# Patient Record
Sex: Female | Born: 1950 | Race: Black or African American | Hispanic: No | Marital: Married | Smoking: Never smoker
Health system: Southern US, Community
[De-identification: ages and names within clinical notes are randomized; demographics above are authoritative.]

## PROBLEM LIST (undated history)

## (undated) DIAGNOSIS — D573 Sickle-cell trait: Secondary | ICD-10-CM

## (undated) DIAGNOSIS — M069 Rheumatoid arthritis, unspecified: Secondary | ICD-10-CM

## (undated) DIAGNOSIS — R509 Fever, unspecified: Secondary | ICD-10-CM

## (undated) DIAGNOSIS — M810 Age-related osteoporosis without current pathological fracture: Secondary | ICD-10-CM

## (undated) DIAGNOSIS — K5790 Diverticulosis of intestine, part unspecified, without perforation or abscess without bleeding: Secondary | ICD-10-CM

## (undated) DIAGNOSIS — M329 Systemic lupus erythematosus, unspecified: Secondary | ICD-10-CM

## (undated) DIAGNOSIS — H269 Unspecified cataract: Secondary | ICD-10-CM

## (undated) DIAGNOSIS — N12 Tubulo-interstitial nephritis, not specified as acute or chronic: Secondary | ICD-10-CM

## (undated) DIAGNOSIS — T7840XA Allergy, unspecified, initial encounter: Secondary | ICD-10-CM

## (undated) DIAGNOSIS — M1990 Unspecified osteoarthritis, unspecified site: Secondary | ICD-10-CM

## (undated) DIAGNOSIS — IMO0002 Reserved for concepts with insufficient information to code with codable children: Secondary | ICD-10-CM

## (undated) DIAGNOSIS — D649 Anemia, unspecified: Secondary | ICD-10-CM

## (undated) HISTORY — DX: Reserved for concepts with insufficient information to code with codable children: IMO0002

## (undated) HISTORY — DX: Sickle-cell trait: D57.3

## (undated) HISTORY — DX: Unspecified cataract: H26.9

## (undated) HISTORY — PX: ABDOMINAL HYSTERECTOMY: SUR658

## (undated) HISTORY — DX: Anemia, unspecified: D64.9

## (undated) HISTORY — PX: BUNIONECTOMY: SHX129

## (undated) HISTORY — DX: Tubulo-interstitial nephritis, not specified as acute or chronic: N12

## (undated) HISTORY — DX: Rheumatoid arthritis, unspecified: M06.9

## (undated) HISTORY — DX: Allergy, unspecified, initial encounter: T78.40XA

## (undated) HISTORY — DX: Systemic lupus erythematosus, unspecified: M32.9

## (undated) HISTORY — PX: ABDOMINAL HYSTERECTOMY: SHX81

## (undated) HISTORY — DX: Unspecified osteoarthritis, unspecified site: M19.90

## (undated) HISTORY — PX: UPPER GASTROINTESTINAL ENDOSCOPY: SHX188

## (undated) HISTORY — DX: Diverticulosis of intestine, part unspecified, without perforation or abscess without bleeding: K57.90

## (undated) HISTORY — DX: Fever, unspecified: R50.9

## (undated) HISTORY — DX: Age-related osteoporosis without current pathological fracture: M81.0

---

## 2000-11-14 ENCOUNTER — Emergency Department (HOSPITAL_COMMUNITY): Admission: EM | Admit: 2000-11-14 | Discharge: 2000-11-14 | Payer: Self-pay | Admitting: Emergency Medicine

## 2000-11-14 ENCOUNTER — Encounter: Payer: Self-pay | Admitting: Emergency Medicine

## 2000-12-28 ENCOUNTER — Emergency Department (HOSPITAL_COMMUNITY): Admission: EM | Admit: 2000-12-28 | Discharge: 2000-12-28 | Payer: Self-pay | Admitting: *Deleted

## 2001-08-12 ENCOUNTER — Encounter: Payer: Self-pay | Admitting: Specialist

## 2001-08-12 ENCOUNTER — Ambulatory Visit (HOSPITAL_COMMUNITY): Admission: RE | Admit: 2001-08-12 | Discharge: 2001-08-12 | Payer: Self-pay | Admitting: Specialist

## 2003-07-30 ENCOUNTER — Ambulatory Visit (HOSPITAL_COMMUNITY): Admission: RE | Admit: 2003-07-30 | Discharge: 2003-07-30 | Payer: Self-pay | Admitting: Specialist

## 2003-08-02 ENCOUNTER — Ambulatory Visit (HOSPITAL_COMMUNITY): Admission: RE | Admit: 2003-08-02 | Discharge: 2003-08-02 | Payer: Self-pay | Admitting: Pulmonary Disease

## 2004-08-21 ENCOUNTER — Ambulatory Visit (HOSPITAL_COMMUNITY): Admission: RE | Admit: 2004-08-21 | Discharge: 2004-08-21 | Payer: Self-pay | Admitting: Specialist

## 2005-01-15 DIAGNOSIS — K5790 Diverticulosis of intestine, part unspecified, without perforation or abscess without bleeding: Secondary | ICD-10-CM

## 2005-01-15 HISTORY — DX: Diverticulosis of intestine, part unspecified, without perforation or abscess without bleeding: K57.90

## 2005-08-28 ENCOUNTER — Ambulatory Visit (HOSPITAL_COMMUNITY): Admission: RE | Admit: 2005-08-28 | Discharge: 2005-08-28 | Payer: Self-pay | Admitting: Specialist

## 2005-09-10 ENCOUNTER — Ambulatory Visit: Payer: Self-pay | Admitting: Gastroenterology

## 2005-10-02 ENCOUNTER — Ambulatory Visit: Payer: Self-pay | Admitting: Gastroenterology

## 2005-10-02 ENCOUNTER — Ambulatory Visit (HOSPITAL_COMMUNITY): Admission: RE | Admit: 2005-10-02 | Discharge: 2005-10-02 | Payer: Self-pay | Admitting: Gastroenterology

## 2005-10-02 HISTORY — PX: COLONOSCOPY: SHX174

## 2006-09-02 ENCOUNTER — Ambulatory Visit (HOSPITAL_COMMUNITY): Admission: RE | Admit: 2006-09-02 | Discharge: 2006-09-02 | Payer: Self-pay | Admitting: Specialist

## 2007-09-26 ENCOUNTER — Ambulatory Visit (HOSPITAL_COMMUNITY): Admission: RE | Admit: 2007-09-26 | Discharge: 2007-09-26 | Payer: Self-pay | Admitting: Specialist

## 2008-11-08 ENCOUNTER — Encounter: Admission: RE | Admit: 2008-11-08 | Discharge: 2008-11-08 | Payer: Self-pay | Admitting: Obstetrics and Gynecology

## 2009-10-14 ENCOUNTER — Ambulatory Visit (HOSPITAL_COMMUNITY): Admission: RE | Admit: 2009-10-14 | Discharge: 2009-10-14 | Payer: Self-pay | Admitting: Pulmonary Disease

## 2009-11-09 ENCOUNTER — Encounter: Admission: RE | Admit: 2009-11-09 | Discharge: 2009-11-09 | Payer: Self-pay | Admitting: Obstetrics and Gynecology

## 2010-04-26 ENCOUNTER — Emergency Department (HOSPITAL_COMMUNITY): Payer: BC Managed Care – PPO

## 2010-04-26 ENCOUNTER — Inpatient Hospital Stay (HOSPITAL_COMMUNITY)
Admission: AD | Admit: 2010-04-26 | Discharge: 2010-04-30 | DRG: 321 | Disposition: A | Discharge status: Home / Self Care | Payer: BC Managed Care – PPO | Source: Ambulatory Visit | Attending: Pulmonary Disease | Admitting: Pulmonary Disease

## 2010-04-26 DIAGNOSIS — N12 Tubulo-interstitial nephritis, not specified as acute or chronic: Principal | ICD-10-CM | POA: Diagnosis present

## 2010-04-26 DIAGNOSIS — D509 Iron deficiency anemia, unspecified: Secondary | ICD-10-CM | POA: Diagnosis present

## 2010-04-26 LAB — URINE MICROSCOPIC-ADD ON

## 2010-04-26 LAB — COMPREHENSIVE METABOLIC PANEL
AST: 31 U/L (ref 0–37)
Albumin: 3.5 g/dL (ref 3.5–5.2)
BUN: 11 mg/dL (ref 6–23)
CO2: 26 mEq/L (ref 19–32)
Chloride: 100 mEq/L (ref 96–112)
Glucose, Bld: 129 mg/dL — ABNORMAL HIGH (ref 70–99)
Potassium: 3.7 mEq/L (ref 3.5–5.1)
Total Protein: 7.7 g/dL (ref 6.0–8.3)

## 2010-04-26 LAB — DIFFERENTIAL
Basophils Absolute: 0 10*3/uL (ref 0.0–0.1)
Basophils Relative: 0 % (ref 0–1)
Lymphocytes Relative: 7 % — ABNORMAL LOW (ref 12–46)
Lymphs Abs: 0.6 10*3/uL — ABNORMAL LOW (ref 0.7–4.0)

## 2010-04-26 LAB — CBC
Hemoglobin: 11.4 g/dL — ABNORMAL LOW (ref 12.0–15.0)
MCH: 27.9 pg (ref 26.0–34.0)
MCHC: 34.5 g/dL (ref 30.0–36.0)
MCV: 80.7 fL (ref 78.0–100.0)
Platelets: 167 10*3/uL (ref 150–400)
RBC: 4.09 MIL/uL (ref 3.87–5.11)
RDW: 12.5 % (ref 11.5–15.5)

## 2010-04-26 LAB — URINALYSIS, ROUTINE W REFLEX MICROSCOPIC: Glucose, UA: NEGATIVE mg/dL

## 2010-04-27 LAB — URINE CULTURE

## 2010-04-27 LAB — BASIC METABOLIC PANEL
CO2: 18 mEq/L — ABNORMAL LOW (ref 19–32)
Calcium: 6.7 mg/dL — ABNORMAL LOW (ref 8.4–10.5)
GFR calc non Af Amer: 47 mL/min — ABNORMAL LOW (ref >60)
Sodium: 130 mEq/L — ABNORMAL LOW (ref 135–145)

## 2010-04-28 LAB — CBC
HCT: 26.6 % — ABNORMAL LOW (ref 36.0–46.0)
Hemoglobin: 9.4 g/dL — ABNORMAL LOW (ref 12.0–15.0)
MCHC: 35.3 g/dL (ref 30.0–36.0)
RBC: 3.4 MIL/uL — ABNORMAL LOW (ref 3.87–5.11)
RDW: 13.4 % (ref 11.5–15.5)

## 2010-04-28 LAB — DIFFERENTIAL
Basophils Relative: 1 % (ref 0–1)
Lymphocytes Relative: 37 % (ref 12–46)
Lymphs Abs: 1.6 10*3/uL (ref 0.7–4.0)
Monocytes Relative: 2 % — ABNORMAL LOW (ref 3–12)
Neutrophils Relative %: 59 % (ref 43–77)

## 2010-04-28 LAB — FOLATE: Folate: 11.8 ng/mL

## 2010-04-28 LAB — BASIC METABOLIC PANEL
Creatinine, Ser: 0.85 mg/dL (ref 0.4–1.2)
GFR calc Af Amer: >60 mL/min (ref >60)
Glucose, Bld: 89 mg/dL (ref 70–99)

## 2010-04-30 LAB — CBC
HCT: 27.6 % — ABNORMAL LOW (ref 36.0–46.0)
Hemoglobin: 9.5 g/dL — ABNORMAL LOW (ref 12.0–15.0)
MCHC: 34.4 g/dL (ref 30.0–36.0)
MCV: 79.1 fL (ref 78.0–100.0)
RDW: 13.8 % (ref 11.5–15.5)

## 2010-04-30 LAB — DIFFERENTIAL
Eosinophils Relative: 1 % (ref 0–5)
Lymphocytes Relative: 46 % (ref 12–46)
Lymphs Abs: 2.4 10*3/uL (ref 0.7–4.0)
Monocytes Absolute: 0.6 10*3/uL (ref 0.1–1.0)
Monocytes Relative: 12 % (ref 3–12)

## 2010-04-30 LAB — BASIC METABOLIC PANEL
BUN: 7 mg/dL (ref 6–23)
CO2: 19 mEq/L (ref 19–32)
Calcium: 8.1 mg/dL — ABNORMAL LOW (ref 8.4–10.5)
GFR calc non Af Amer: >60 mL/min (ref >60)
Glucose, Bld: 78 mg/dL (ref 70–99)

## 2010-05-01 LAB — CULTURE, BLOOD (ROUTINE X 2)
Culture: NO GROWTH
Culture: NO GROWTH

## 2010-05-01 NOTE — Progress Notes (Signed)
  NAMEFILOMENA, POKORNEY              ACCOUNT NO.:  [id number]  MEDICAL RECORD NO.:  [id number]           PATIENT TYPE:  I  LOCATION:  A338                          FACILITY:  [facility]  PHYSICIAN:  Dorthie Santini L. Juanetta Gosling, M.D.DATE OF BIRTH:  27-Jan-1950  DATE OF PROCEDURE: DATE OF DISCHARGE:                                PROGRESS NOTE   Ms. Fehrenbach was admitted with febrile illness, thought to be pyelonephritis.  She is on Rocephin and gentamicin.  She has had more fever last night to 103.1.  This morning she is down to 99.5, pulse 89, respirations 18, blood pressure 91/51, O2 sat 93% on room air.  Overall, she looks a little more comfortable.  She does not have any other new complaints.  She is going to continue on Rocephin and gentamicin.  I have ordered another set of blood cultures if she has more fever.  We will continue with everything else.  I am awaiting culture results and then may be able to change her antibiotics to her particular organism if we grow anything.  Her blood cultures thus far are negative.  Urine culture not ready.     Ivo Moga L. Juanetta Gosling, M.D.     ELH/MEDQ  D:  04/27/2010  T:  04/27/2010  Job:  [id number]  Electronically Signed by Kari Baars M.D. on 05/01/2010 08:27:17 AM

## 2010-05-01 NOTE — Progress Notes (Signed)
  [name], [name]              ACCOUNT NO.:  [id number]  MEDICAL RECORD NO.:  [id number]           PATIENT TYPE:  I  LOCATION:  A338                          FACILITY:  [facility]  PHYSICIAN:  Ortha Metts L. Juanetta Gosling, M.D.DATE OF BIRTH:  [birth year]  DATE OF PROCEDURE: DATE OF DISCHARGE:                                PROGRESS NOTE   [name] was admitted with what was thought to be pyelonephritis.  It is not totally clear that that is what she has, but she did have an urinary tract infection and then developed flank pain and increased fever.  Her urine culture that has only grown 20,000 colonies, however, she had been on some oral antibiotics, so it is difficult to be certain exactly what the diagnosis is.  This morning, she says that she feels better.  She did still have some fairly low-grade temperature of 100.7, pulse is 103, respirations 18, blood pressure 107/68, O2 sats 96%.  She says that she is a little bit nauseated, but otherwise looks better.  ASSESSMENT:  I think she is improving.  I am going to switch her from Rocephin to cefepime since we do not have a definite culture that is positive.  I do have a blood cultures ordered if she gets a higher temperature, we will continue with everything else, continue her IV fluids and follow.  I do not think there is anything else to add.  I had contemplated doing scans of her chest, abdomen and pelvis, but since she is improving I think I will not do that.     Jasdeep Dejarnett L. Juanetta Gosling, M.D.     ELH/MEDQ  D:  04/28/2010  T:  04/28/2010  Job:  [id number]  Electronically Signed by Kari Baars M.D. on 05/01/2010 11:91:47 AM

## 2010-05-01 NOTE — H&P (Signed)
  NAMEDARCEL, ZICK              ACCOUNT NO.:  [id number]  MEDICAL RECORD NO.:  [id number]           PATIENT TYPE:  I  LOCATION:  A338                          FACILITY:  [facility]  PHYSICIAN:  Ki Corbo L. Juanetta Gosling, M.D.DATE OF BIRTH:  [birth year]  DATE OF ADMISSION:  04/26/2010 DATE OF DISCHARGE:  LH                             HISTORY & PHYSICAL   REASON FOR ADMISSION:  Probable pyelonephritis.  HISTORY:  Ms. Boer is a 60 year old who came to the emergency room because of fever.  She had been seen in my office about a month ago, looked like she had a UTI at that time.  She was treated with Cipro which she stopped because she developed lip swelling and then she was treated with Bactrim which she stopped because of itching.  Her symptoms seemed to improve, so she did not let me know that she had stopped the Bactrim.  She developed chills about 4 days ago, then developed a temperature as high as 104 and came to the emergency room.  She has not had a cough, diarrhea, sore throat, vomiting, and has not had a lot of dysuria.  She is complaining of pain in her flank.  Her past medical history other than the episode of cystitis is negative. She has had a hysterectomy.  SOCIAL HISTORY:  She lives at home.  She does not smoke.  She does not use any alcohol.  She does not use any illicit drugs.  FAMILY HISTORY:  Not known to be positive for any sort of kidney disease.  REVIEW OF SYSTEMS:  Except as mentioned is negative.  PHYSICAL EXAMINATION:  GENERAL:  In the emergency room showed that she is a well-developed, well-nourished thin female in no acute distress except for her temperature and she was having chills during the exam in the ER HEENT:  Shows that her pupils are equal, round, and reactive to light and accommodation.  Nose and throat are clear.  Mucous membranes are dry. NECK:  Supple without masses. CHEST:  Shows some rhonchi bilaterally. HEART:  Regular without murmur,  gallop, or rub.  She is tender in the left CVA. EXTREMITIES:  Showed no edema. CENTRAL NERVOUS SYSTEM EXAMINATION:  Grossly intact.  LABORATORY WORK:  White count is 9000, hemoglobin is 11.4, platelets 167, she had 91% neutrophils.  Urinalysis showed negative nitrite, but positive leukocyte esterase.  Her urine showed 3-6 white cells, 0-2 red. Her sodium was 133, glucose of 129, alkaline phosphatase is low.  Her chest x-ray, no acute process.  ASSESSMENT:  She has what I think is pyelonephritis.  She has fever, chills, had a previous urinary tract infection, but it is not classic. She does have left flank tenderness.  My plan is to put her on double antibiotics.  She has had blood cultures, etc.  Continue with all the other treatments and follow.     Bahja Bence L. Juanetta Gosling, M.D.     ELH/MEDQ  D:  04/26/2010  T:  04/26/2010  Job:  [id number]  Electronically Signed by Kari Baars M.D. on 05/01/2010 08:27:03 AM

## 2010-05-01 NOTE — Progress Notes (Signed)
  NAMESHAMARIA, [name]              ACCOUNT NO.:  [id number]  MEDICAL RECORD NO.:  [id number]           PATIENT TYPE:  I  LOCATION:  A338                          FACILITY:  [facility]  PHYSICIAN:  Gavyn Zoss L. Juanetta Gosling, M.D.DATE OF BIRTH:  1950-11-29  DATE OF PROCEDURE: DATE OF DISCHARGE:                                PROGRESS NOTE   [name] says she is feeling better, but still somewhat nauseated. The Zofran does not seem to be working, so I am going to give her low dose of Phenergan.  Otherwise, everything is about the same.  She has no other new complaints.  Her exam shows her temperature is 98.3, pulse 81, respirations 16, blood pressure 109/72, O2 sats 98% on room air.  Her chest fairly clear.  Her heart is regular without gallop.  Her abdomen is soft.  She looks better, but she is still nauseated.  ASSESSMENT:  She has febrile illness, presumably pyelonephritis.  She may have some viral component as well.  She is better, but still nauseated.  She is also anemic and it looks like iron deficiency on her lab work.  I am going to plan to have that further evaluated as an outpatient.  My assessment then is that she is improved.  Plan is to continue with her current treatments and medications.  Add the Phenergan and see how she does.  Repeat labs in the morning.     [name] L. Juanetta Gosling, M.D.     ELH/MEDQ  D:  04/30/2010  T:  04/30/2010  Job:  [id number]  Electronically Signed by Kari Baars M.D. on 05/01/2010 08:27:29 AM

## 2010-05-01 NOTE — Discharge Summary (Signed)
  NAMEGIMENA, [name]              ACCOUNT NO.:  [id number]  MEDICAL RECORD NO.:  [id number]           PATIENT TYPE:  I  LOCATION:  A338                          FACILITY:  [facility]  PHYSICIAN:  Kimmy Parish L. Juanetta Gosling, M.D.DATE OF BIRTH:  March 23, 1950  DATE OF ADMISSION:  04/26/2010 DATE OF DISCHARGE:  LH                              DISCHARGE SUMMARY   FINAL DISCHARGE DIAGNOSES: 1. Febrile illness presumed pyelonephritis. 2. Anemia probably iron deficient. 3. Previous episode of cystitis. 4. History of hysterectomy.  [name] is a 60 year old who had come to my office about a month ago with what appeared to be a urinary tract infection.  She was treated with Cipro which she developed lip swelling from that became and so that was discontinued.  She was switched to Bactrim, but she stopped her Bactrim because of itching.  Her symptoms improved.  She did not let me know that she had stopped the Bactrim then she developed chills about 4 days prior to admission, had a temperature as high as 104 and came to the emergency room.  Exam on admission showed a thin female who is in no acute distress.  She was having temperature and chills during the exam.  She had some tenderness in the left costovertebral angle.  White blood count was 9000, hemoglobin 11.4.  She had a positive leukocyte esterase, but her urine did not grow anything.  It was felt that she probably had pyelonephritis considering the fever, the flank tenderness and the findings on urinalysis, although she did not grow an organism.  It was felt that she probably had a partially treated UTI. She was started on Rocephin and gentamicin and then she was nauseated, so she was given Zofran.  Zofran became less effective, so she was switched to Phenergan which worked very well.  By the time of discharge, she was afebrile.  Her nausea was much improved.  She was able to ambulate in the halls without difficulty and she will be discharged  home on Ceftin 500 mg b.i.d. x10 days.  She will continue with Tylenol at home that she was taking.  I am going to put her on Phenergan because that was effective for her nausea 1-2 teaspoons at home q.4 h. p.r.n. nausea.  She will be on Protonix 40 mg p.o. daily.  She had been using Alka-Seltzer plus and she can continue that at home as needed.  She will follow up in my office in about 2 weeks.     Margel Joens L. Juanetta Gosling, M.D.    ELH/MEDQ  D:  04/30/2010  T:  04/30/2010  Job:  [id number]  Electronically Signed by Kari Baars M.D. on 05/01/2010 08:27:10 AM

## 2010-05-14 NOTE — Group Therapy Note (Signed)
  [name]              ACCOUNT NO.:  [id number]  MEDICAL RECORD NO.:  [id number]          PATIENT TYPE:  LOCATION:                                 FACILITY:  PHYSICIAN:  [name], M.D.DATE OF BIRTH:  1950/09/05  DATE OF PROCEDURE: DATE OF DISCHARGE:                                PROGRESS NOTE   [name] says she feels great.  She was able to eat yesterday.  The Phenergan worked exceptionally well.  Her hemoglobin today is 9.5.  Her white count is 5100.  Electrolytes were normal.  Her exam shows that her chest is clear.  Heart is regular.  She does not have any flank tenderness.  Assessment is that she is improved.  Plan is for her to continue with her current treatments, but I am going to discharge her home today.  Please see discharge summary for details.     [name], M.D.     ELH/MEDQ  D:  04/30/2010  T:  04/30/2010  Job:  [id number]  Electronically Signed by [name] M.D. on 05/14/2010 02:42:16 PM

## 2010-05-29 ENCOUNTER — Inpatient Hospital Stay (HOSPITAL_COMMUNITY)
Admission: EM | Admit: 2010-05-29 | Discharge: 2010-06-08 | DRG: 584 | Disposition: A | Discharge status: Home Health | Payer: BC Managed Care – PPO | Attending: Pulmonary Disease | Admitting: Pulmonary Disease

## 2010-05-29 DIAGNOSIS — A419 Sepsis, unspecified organism: Principal | ICD-10-CM | POA: Diagnosis present

## 2010-05-29 DIAGNOSIS — J189 Pneumonia, unspecified organism: Secondary | ICD-10-CM | POA: Diagnosis present

## 2010-05-29 DIAGNOSIS — E876 Hypokalemia: Secondary | ICD-10-CM | POA: Diagnosis present

## 2010-05-29 DIAGNOSIS — N390 Urinary tract infection, site not specified: Secondary | ICD-10-CM | POA: Diagnosis present

## 2010-05-29 DIAGNOSIS — D649 Anemia, unspecified: Secondary | ICD-10-CM | POA: Diagnosis present

## 2010-05-30 ENCOUNTER — Emergency Department (HOSPITAL_COMMUNITY): Payer: BC Managed Care – PPO

## 2010-05-30 ENCOUNTER — Inpatient Hospital Stay (HOSPITAL_COMMUNITY): Payer: BC Managed Care – PPO

## 2010-05-30 ENCOUNTER — Encounter (HOSPITAL_COMMUNITY): Payer: Self-pay | Admitting: Radiology

## 2010-05-30 LAB — BASIC METABOLIC PANEL
BUN: 11 mg/dL (ref 6–23)
Calcium: 9.6 mg/dL (ref 8.4–10.5)
GFR calc non Af Amer: >60 mL/min (ref >60)
Glucose, Bld: 100 mg/dL — ABNORMAL HIGH (ref 70–99)
Potassium: 3 mEq/L — ABNORMAL LOW (ref 3.5–5.1)

## 2010-05-30 LAB — DIFFERENTIAL
Basophils Absolute: 0 10*3/uL (ref 0.0–0.1)
Eosinophils Absolute: 0 10*3/uL (ref 0.0–0.7)
Eosinophils Relative: 0 % (ref 0–5)
Lymphs Abs: 0.4 10*3/uL — ABNORMAL LOW (ref 0.7–4.0)
Monocytes Absolute: 0.1 10*3/uL (ref 0.1–1.0)

## 2010-05-30 LAB — CBC
HCT: 30.3 % — ABNORMAL LOW (ref 36.0–46.0)
MCHC: 34 g/dL (ref 30.0–36.0)
MCV: 81.5 fL (ref 78.0–100.0)
Platelets: 205 10*3/uL (ref 150–400)
RDW: 13.5 % (ref 11.5–15.5)
WBC: 4.7 10*3/uL (ref 4.0–10.5)

## 2010-05-30 LAB — HEPATIC FUNCTION PANEL
Alkaline Phosphatase: 49 U/L (ref 39–117)
Bilirubin, Direct: <0.1 mg/dL (ref 0.0–0.3)
Total Bilirubin: 0.2 mg/dL — ABNORMAL LOW (ref 0.3–1.2)

## 2010-05-30 LAB — URINALYSIS, ROUTINE W REFLEX MICROSCOPIC
Bilirubin Urine: NEGATIVE
Ketones, ur: NEGATIVE mg/dL
Nitrite: NEGATIVE
Protein, ur: NEGATIVE mg/dL
Urobilinogen, UA: 0.2 mg/dL (ref 0.0–1.0)
pH: 5.5 (ref 5.0–8.0)

## 2010-05-30 LAB — CARBOXYHEMOGLOBIN
Carboxyhemoglobin: 1 % (ref 0.5–1.5)
O2 Saturation: 68.4 %

## 2010-05-30 LAB — LACTIC ACID, PLASMA: Lactic Acid, Venous: 3.4 mmol/L — ABNORMAL HIGH (ref 0.5–2.2)

## 2010-05-30 LAB — PROCALCITONIN: Procalcitonin: 11.5 ng/mL

## 2010-05-30 LAB — MRSA PCR SCREENING: MRSA by PCR: NEGATIVE

## 2010-05-30 MED ORDER — IOHEXOL 300 MG/ML  SOLN
50.0000 mL | Freq: Once | INTRAMUSCULAR | Status: AC | PRN
  Administered 2010-05-30: 50 mL via INTRAVENOUS

## 2010-05-30 MED ORDER — IOHEXOL 350 MG/ML SOLN
100.0000 mL | Freq: Once | INTRAVENOUS | Status: AC | PRN
  Administered 2010-05-30: 100 mL via INTRAVENOUS

## 2010-05-31 ENCOUNTER — Inpatient Hospital Stay (HOSPITAL_COMMUNITY): Payer: BC Managed Care – PPO

## 2010-05-31 LAB — BASIC METABOLIC PANEL
BUN: 8 mg/dL (ref 6–23)
CO2: 16 mEq/L — ABNORMAL LOW (ref 19–32)
Calcium: 6.7 mg/dL — ABNORMAL LOW (ref 8.4–10.5)
Chloride: 116 mEq/L — ABNORMAL HIGH (ref 96–112)
Creatinine, Ser: 0.74 mg/dL (ref 0.4–1.2)
GFR calc Af Amer: >60 mL/min (ref >60)
GFR calc non Af Amer: >60 mL/min (ref >60)
Glucose, Bld: 140 mg/dL — ABNORMAL HIGH (ref 70–99)
Potassium: 3.9 mEq/L (ref 3.5–5.1)
Sodium: 139 mEq/L (ref 135–145)

## 2010-05-31 LAB — BLOOD GAS, ARTERIAL
Acid-base deficit: 11 mmol/L — ABNORMAL HIGH (ref 0.0–2.0)
Bicarbonate: 13.1 mEq/L — ABNORMAL LOW (ref 20.0–24.0)
Drawn by: 23534
FIO2: 0.21 %
O2 Content: 21 L/min
O2 Saturation: 98.5 %
Patient temperature: 37
TCO2: 12.5 mmol/L (ref 0–100)
pCO2 arterial: 22.5 mmHg — ABNORMAL LOW (ref 35.0–45.0)
pH, Arterial: 7.383 (ref 7.350–7.400)
pO2, Arterial: 104 mmHg — ABNORMAL HIGH (ref 80.0–100.0)

## 2010-05-31 LAB — DIFFERENTIAL
Basophils Absolute: 0 10*3/uL (ref 0.0–0.1)
Lymphocytes Relative: 7 % — ABNORMAL LOW (ref 12–46)
Lymphs Abs: 0.4 10*3/uL — ABNORMAL LOW (ref 0.7–4.0)
Monocytes Absolute: 0.1 10*3/uL (ref 0.1–1.0)
Neutro Abs: 5.4 10*3/uL (ref 1.7–7.7)

## 2010-05-31 LAB — URINE CULTURE
Colony Count: 4000
Culture  Setup Time: 201205152110

## 2010-05-31 LAB — CBC
HCT: 23.8 % — ABNORMAL LOW (ref 36.0–46.0)
Hemoglobin: 8 g/dL — ABNORMAL LOW (ref 12.0–15.0)
MCHC: 33.6 g/dL (ref 30.0–36.0)
MCV: 81.2 fL (ref 78.0–100.0)
WBC: 5.9 10*3/uL (ref 4.0–10.5)

## 2010-05-31 LAB — HEPATIC FUNCTION PANEL
ALT: 17 U/L (ref 0–35)
AST: 25 U/L (ref 0–37)
Albumin: 2 g/dL — ABNORMAL LOW (ref 3.5–5.2)
Alkaline Phosphatase: 31 U/L — ABNORMAL LOW (ref 39–117)
Bilirubin, Direct: 0.1 mg/dL (ref 0.0–0.3)
Indirect Bilirubin: 0.1 mg/dL — ABNORMAL LOW (ref 0.3–0.9)
Total Bilirubin: 0.2 mg/dL — ABNORMAL LOW (ref 0.3–1.2)
Total Protein: 4.6 g/dL — ABNORMAL LOW (ref 6.0–8.3)

## 2010-05-31 LAB — GENTAMICIN LEVEL, RANDOM: Gentamicin Rm: 4.2 ug/mL

## 2010-06-01 ENCOUNTER — Inpatient Hospital Stay (HOSPITAL_COMMUNITY): Payer: BC Managed Care – PPO

## 2010-06-01 DIAGNOSIS — R578 Other shock: Secondary | ICD-10-CM

## 2010-06-01 LAB — COMPREHENSIVE METABOLIC PANEL
ALT: 28 U/L (ref 0–35)
CO2: 18 mEq/L — ABNORMAL LOW (ref 19–32)
Calcium: 7.9 mg/dL — ABNORMAL LOW (ref 8.4–10.5)
GFR calc non Af Amer: >60 mL/min (ref >60)
Glucose, Bld: 106 mg/dL — ABNORMAL HIGH (ref 70–99)
Sodium: 140 mEq/L (ref 135–145)
Total Bilirubin: 0.2 mg/dL — ABNORMAL LOW (ref 0.3–1.2)

## 2010-06-01 LAB — CBC
HCT: 22.8 % — ABNORMAL LOW (ref 36.0–46.0)
Hemoglobin: 7.9 g/dL — ABNORMAL LOW (ref 12.0–15.0)
MCH: 27.5 pg (ref 26.0–34.0)
MCHC: 34.6 g/dL (ref 30.0–36.0)

## 2010-06-01 LAB — DIFFERENTIAL
Basophils Absolute: 0 10*3/uL (ref 0.0–0.1)
Lymphocytes Relative: 16 % (ref 12–46)
Monocytes Absolute: 0.1 10*3/uL (ref 0.1–1.0)
Monocytes Relative: 3 % (ref 3–12)
Neutro Abs: 4.1 10*3/uL (ref 1.7–7.7)

## 2010-06-02 ENCOUNTER — Inpatient Hospital Stay (HOSPITAL_COMMUNITY): Payer: BC Managed Care – PPO

## 2010-06-02 LAB — DIFFERENTIAL
Basophils Absolute: 0 K/uL (ref 0.0–0.1)
Basophils Relative: 0 % (ref 0–1)
Eosinophils Absolute: 0 K/uL (ref 0.0–0.7)
Eosinophils Relative: 0 % (ref 0–5)
Lymphocytes Relative: 19 % (ref 12–46)
Lymphs Abs: 0.9 K/uL (ref 0.7–4.0)
Monocytes Absolute: 0.2 K/uL (ref 0.1–1.0)
Monocytes Relative: 5 % (ref 3–12)
Neutro Abs: 3.6 K/uL (ref 1.7–7.7)
Neutrophils Relative %: 77 % (ref 43–77)

## 2010-06-02 LAB — BASIC METABOLIC PANEL WITH GFR
BUN: 8 mg/dL (ref 6–23)
CO2: 20 meq/L (ref 19–32)
Calcium: 8.5 mg/dL (ref 8.4–10.5)
Chloride: 111 meq/L (ref 96–112)
Creatinine, Ser: 0.69 mg/dL (ref 0.4–1.2)
GFR calc non Af Amer: >60 mL/min
Glucose, Bld: 104 mg/dL — ABNORMAL HIGH (ref 70–99)
Potassium: 3.5 meq/L (ref 3.5–5.1)
Sodium: 138 meq/L (ref 135–145)

## 2010-06-02 LAB — CBC
HCT: 23.6 % — ABNORMAL LOW (ref 36.0–46.0)
Hemoglobin: 8.2 g/dL — ABNORMAL LOW (ref 12.0–15.0)
MCH: 27.4 pg (ref 26.0–34.0)
MCHC: 34.7 g/dL (ref 30.0–36.0)
MCV: 78.9 fL (ref 78.0–100.0)
Platelets: 161 K/uL (ref 150–400)
RBC: 2.99 MIL/uL — ABNORMAL LOW (ref 3.87–5.11)
RDW: 13.9 % (ref 11.5–15.5)
WBC: 4.7 K/uL (ref 4.0–10.5)

## 2010-06-02 NOTE — Consult Note (Signed)
[name], [name]              ACCOUNT NO.:  [id number]   MEDICAL RECORD NO.:  [id number]          PATIENT TYPE:  AMB   LOCATION:  DAY                           FACILITY:  [facility]   PHYSICIAN:  Kassie Mends, M.D.      DATE OF BIRTH:  10/24/1950   DATE OF CONSULTATION:  09/10/2005  DATE OF DISCHARGE:                                   CONSULTATION   ADDRESS:  Oneal Deputy. Juanetta Gosling, M.D.  [address]  [place]  [place] [address]   BODY:  Dear Dr. Juanetta Gosling:   I am seeing [name] as a new patient consultation per your request. I am  seeing her for abdominal pain.   [name] is a 60 year old female who had lower abdominal pain. She was  subsequently diagnosed with a bladder infection and placed on Cipro. She  completed her course and is now better. She denies any nausea, vomiting,  heartburn, indigestion or difficulty swallowing. She has had a little  constipation for years. She denies any diarrhea or black tarry stools. She  denies any weight loss. She has never had a colonoscopy.   PAST MEDICAL HISTORY:  Rheumatoid arthritis diagnosed by blood work last  year. She also has sickle cell trait. She has had a hysterectomy and  bunionectomy.   ALLERGIES:  She is not allergic to any medicines.   CURRENT MEDICATIONS:  1. Naprosyn 500 mg as needed.  2. Flexeril 10 mg as needed.  3. Macrobid 100 mg b.i.d. for seven days.  4. Tylenol as needed.   FAMILY HISTORY:  She has no family history of colon cancer, uterine, breast  or ovarian cancer. She has no family history of polyps.   SOCIAL HISTORY:  She has been married for 19 years. She has three children:  ages 20, 65, and 22. 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 She has no tobacco or alcohol use.   REVIEW OF SYSTEMS:  Per the HPI; otherwise, all systems are negative.   PHYSICAL EXAMINATION:  VITAL SIGNS:  Weight 105.5 pounds, height 5 feet 0  inches, BMI 20.6 (normal), temperature 98.6, blood  pressure 120/74, pulse  76.  GENERAL:  She is in no apparent distress, alert and oriented x4.  HEENT:  Atraumatic, normocephalic. Pupils equal and react to light. Mouth no  oral lesions. Posterior pharynx without erythema or exudate.  NECK:  Has  full range of motion. No lymphadenopathy.  LUNGS:  Clear to auscultation  bilaterally.  CARDIOVASCULAR:  Shows regular rhythm, no murmur, normal S1  and S2.ABDOMEN:  Her bowel sounds are present, soft, nontender,  nondistended. No rebound or guarding. No hepatosplenomegaly.  EXTREMITIES:  Without cyanosis, clubbing or edema.  NEUROLOGIC:  She has no focal  neurologic deficits.   [name] is a 60 year old female with need for average risk colon cancer  screening. Her abdominal pain has resolved. Thank you for allowing me to see  [name] in consultation.  My recommendations follow.   RECOMMENDATIONS:  Recommend screening colonoscopy. She may follow up with me  as needed.  Please feel free to contact me with additional questions [phone].      Kassie Mends, M.D.  Electronically Signed     SM/MEDQ  D:  09/11/2005  T:  09/12/2005  Job:  [id number]   cc:   Ramon Dredge L. Juanetta Gosling, M.D.  Fax: [phone]

## 2010-06-02 NOTE — Op Note (Signed)
NAMEMARIBEL, [name]              ACCOUNT NO.:  [id number]   MEDICAL RECORD NO.:  [id number]          PATIENT TYPE:  AMB   LOCATION:  DAY                           FACILITY:  [facility]   PHYSICIAN:  Kassie Mends, M.D.      DATE OF BIRTH:  December 03, 1950   DATE OF PROCEDURE:  10/02/2005  DATE OF DISCHARGE:                                 OPERATIVE REPORT   PROCEDURE:  Colonoscopy.   INDICATION FOR EXAM:  [name] is a 60 year old female who presents for  average risk colon cancer screening.   FINDINGS:  1. Rare diverticulosis.  Otherwise, no polyps, masses, inflammatory      changes or vascular ectasia seen.  2. Normal retroflexed view of the rectum.   RECOMMENDATIONS:  1. High fiber diet and diverticulosis handout given.  2. Screening colonoscopy in ten years.  3. Follow up with Dr. Juanetta Gosling.   MEDICATIONS:  1. Demerol 50 mg IV.  2. Versed 5 mg IV.   PROCEDURE TECHNIQUE:  Physical exam was performed.  Informed consent was  obtained from the patient after explaining the benefits, risks and  alternatives to the procedure.  The patient was connected to the monitor and  placed in the left lateral position.  Continuous oxygen was provided by  nasal cannula and IV medicine administered via indwelling cannula.  After  administration of sedation and rectal exam, the patient's rectum was  intubated.  The scope was advanced under direct visualization to the cecum.  The scope was subsequently removed slowly by carefully examining the color,  texture, anatomy and integrity of the mucosa on the way out.  The patient  was recovered in the endoscopy suite and discharged home in satisfactory  condition.      Kassie Mends, M.D.  Electronically Signed    SM/MEDQ  D:  10/02/2005  T:  10/03/2005  Job:  [id number]   cc:   Ramon Dredge L. Juanetta Gosling, M.D.  Fax: [phone]

## 2010-06-03 NOTE — Consult Note (Signed)
  [name], [name]              ACCOUNT NO.:  [id number]  MEDICAL RECORD NO.:  [id number]           PATIENT TYPE:  I  LOCATION:  IC09                          FACILITY:  [facility]  PHYSICIAN:  Tilford Pillar, MD      DATE OF BIRTH:  1950/03/04  DATE OF CONSULTATION:  05/30/2010 DATE OF DISCHARGE:                                CONSULTATION   REASON FOR CONSULTATION:  Urosepsis.  HISTORY OF PRESENT ILLNESS:  The patient is a 60 year old female relatively healthy, who presents with several episodes of urinary tract infection who present to the [facility] feeling ill on her evaluation.  She is suspected to have urosepsis, is transferred from the intensive care unit; however, continues on monitoring and management. Due to sepsis protocol, limited IV access, and need to additional monitoring, the Surgical consultation was made for central line placement.  PAST MEDICAL HISTORY:  All reviewed with the patient and inserted in the patient's chart.  PAST SURGICAL HISTORY:  All reviewed with the patient and inserted in the patient's chart.  MEDICATIONS:  All reviewed with the patient and inserted in the patient's chart.  ALLERGIES:  All reviewed with the patient and inserted in the patient's chart.  FOCUSED PHYSICAL EXAMINATION:  NECK:  Trachea is midline.  No cervical lymphadenopathy. CHEST:  Upper chest wall is __________.  No clavicular deformities.  No upper chest wall scar is noted. EXTREMITIES:  Nonedematous.  She has 2+ radial pulses bilaterally.  LABORATORY DATA:  Pertinent laboratory results were reviewed.  ASSESSMENT:  Urosepsis and hypotension.  PLAN:  I discussed with the patient to proceed with central venous catheter placement.  Risks, benefits, and alternatives were all discussed including, but not limited to risk of bleeding, infection, pneumothoraces.  Her questions and concerns are addressed and the patient is admitted for the planned  procedure.  PROCEDURE:  The patient's left chest wall was prepped with __________solution and draped in standard fashion.  Local anesthetic was instilled along the planned course of venipuncture after the patient was placed in a Trendelenburg position.  At this point, an 18-gauge introducer needle __________identified in the left subclavian vein. Good venous return was obtained.  A  J-wire was advanced using sterile standard Seldinger technique.  __________ catheter was advanced to 18 cm without difficulty.  All 3 ports were aspirated without difficulty and then flushed.  The catheter was secured with to the skin with a 2-0 silk __________.  A sterile dressing was in place and secured with a Tegaderm dressing.  __________and a stat portable chest x-ray was ordered.  The patient tolerated the procedure extremely well.     Tilford Pillar, MD     BZ/MEDQ  D:  05/30/2010  T:  05/31/2010  Job:  [id number]  Electronically Signed by Tilford Pillar MD on 06/03/2010 08:35:44 PM

## 2010-06-03 NOTE — H&P (Signed)
[name], [name]              ACCOUNT NO.:  [id number]  MEDICAL RECORD NO.:  [id number]           PATIENT TYPE:  I  LOCATION:  IC09                          FACILITY:  [facility]  PHYSICIAN:  Natalee Tomkiewicz L. Juanetta Gosling, M.D.DATE OF BIRTH:  [birth year]  DATE OF ADMISSION:  05/29/2010 DATE OF DISCHARGE:  LH                             HISTORY & PHYSICAL   REASON FOR ADMISSION:  Febrile illness.  HISTORY:  [name] is a 60 year old who came to the emergency room with fever.  I saw her in my office last week she was complaining that she had a symptoms of urinary tract infection and I do not remember the organism but she was sensitive to Levaquin.  She had trouble with Cipro, she had trouble with Bactrim, so I have prescribed Levaquin, I told her that it was in the same family as Cipro, but that did not necessarily mean that she have a reaction.  However, she went to the drug store, was told that they were essentially identical, she did not fill the prescription and did not take anything until last night.  Last night she developed fever and took Bactrim which given her problems with itching and it gave her problems with itching again.  Temperature went up so she came to the emergency room and was found to have a temperature of about 103 and she was moderately hypotensive.  She was also anemic when she was in the hospital and that was being worked up.  PAST MEDICAL HISTORY:  Otherwise is essentially negative.  She was hospitalized here between April 11 and the 15th with a very similar episode that was thought to be pyelonephritis but she had taken some antibiotics before she came to the emergency room.  We never grew an organism.  She eventually improved and was discharged home on the 15th.  SOCIAL HISTORY:  She lives at home with family.  She does not use alcohol, tobacco, or illicit drugs.  FAMILY HISTORY:  Not known to be positive for any sort of renal problems.  REVIEW OF SYSTEMS:  She  denies any cough or congestion.  She has had fever as mentioned.  PHYSICAL EXAMINATION:  GENERAL:  A well-developed, thin female who is in no acute distress. VITAL SIGNS:  Blood pressure about in the 70s, pulse about 120, and temperature is 102. HEENT:  Her pupils are reactive.  Mucous membranes are dry. NECK:  Supple without masses. CHEST:  Relatively clear without any wheezing. HEART:  Regular without murmur, gallop, or rub. ABDOMEN:  Soft. EXTREMITIES:  No edema.  LABORATORY DATA:  White blood count is 4700, hemoglobin is 10.3, platelets 205.  BMET shows potassium of 3, BUN is 11, and creatinine 0.66.  Urinalysis is essentially negative.  D-dimer 1.91.  Hepatic function is normal.  She had a CT of the chest done because of the elevated D-dimer and that showed some atelectasis, no evidence of pulmonary embolus.  ASSESSMENT:  She has another episode of fever with definite urinary tract infection when she was in my office, I do not believe that the organism that she had grown is sensitive to  Bactrim but her urine looks clearer, so I am going to get the results from her culture that was done as an outpatient.  I will modify her antibiotics based on that.  I am going to put her in the intensive care unit for the moment until we can get her blood pressure up.  She may need sepsis protocol.  I am going to just give her IV fluids to start and then if she does not bring pressure up well we will initiate full sepsis protocol.  I am going to treat with Levaquin, watch her carefully for allergic reaction.  She is also going to receive Zosyn and gentamicin and then further treatment depending on what we find.  She is scheduled for a CT abdomen and pelvis, this to be done later today.     Servando Kyllonen L. Juanetta Gosling, M.D.     ELH/MEDQ  D:  05/30/2010  T:  05/30/2010  Job:  [id number]  Electronically Signed by Kari Baars M.D. on 06/03/2010 09:40:03 AM

## 2010-06-03 NOTE — Group Therapy Note (Signed)
  NAMEDAVEDA, LAROCK              ACCOUNT NO.:  [id number]  MEDICAL RECORD NO.:  [id number]           PATIENT TYPE:  I  LOCATION:  IC09                          FACILITY:  [facility]  PHYSICIAN:  Monteen Toops L. Juanetta Gosling, M.D.DATE OF BIRTH:  1950-08-05  DATE OF PROCEDURE:  06/02/2010 DATE OF DISCHARGE:                                PROGRESS NOTE   Ms. Herbst was admitted with sepsis but it is not clear what this is from.  She is seems to be doing better.  We do not have a definite source for the sepsis, but she did have a urinary tract infection in my office and also has had what appears to be a pneumonia on chest x-ray. This morning, she is much improved, blood pressures in the 120s, pulse about 60.  Chest is clear.  Heart is regular.  Abdomen is soft.  She looks much more comfortable as mentioned.  Her BUN is 8, creatinine is 0.69, CO2 is come up to 20 now.  Her white blood count 4700, hemoglobin is 8.2, and platelets 161.  Assessment is that she is much improved.  Plan is to transfer out of the Intensive Care Unit today and have her remove the central line, try to get a PICC line.  I would like her to have 10 full days of IV antibiotics since we do not have a definite diagnosis.     Leeum Sankey L. Juanetta Gosling, M.D.     ELH/MEDQ  D:  06/02/2010  T:  06/02/2010  Job:  [id number]  Electronically Signed by Kari Baars M.D. on 06/03/2010 09:40:11 AM

## 2010-06-03 NOTE — Group Therapy Note (Signed)
  NAMENIAMYA, VITTITOW              ACCOUNT NO.:  [id number]  MEDICAL RECORD NO.:  [id number]           PATIENT TYPE:  I  LOCATION:  IC09                          FACILITY:  [facility]  PHYSICIAN:  Jaymi Tinner L. Juanetta Gosling, M.D.DATE OF BIRTH:  1950-04-21  DATE OF PROCEDURE: DATE OF DISCHARGE:                                PROGRESS NOTE   HISTORY OF PRESENT ILLNESS:  Ms. Digiulio remains in the intensive care unit.  She had some more problems with hypotension yesterday evening and received a bolus of fluids and she is better now but I am going to hold off on moving her out of the ICU until I see how she does through today. Her blood pressure is better.  She is off the pressors.  We do not have the definitive cause of her sepsis yet, although she does have some infiltrates on chest x-ray.  Her exam shows that she is awake and alert. She looks like she feels much better.  Blood pressures up in the 110-120 range systolic, pulse in the 80s now.  Her O2 sat is in the high 90s. Her chest is much clearer than before.  Heart is regular.  Her abdomen is soft without masses.  Extremities showed no edema and she overall looks much better.  Her laboratory work shows that her glucose is 106, CO2 is still slightly low at 18.  Her albumin is 2.2.  CBC shows white blood count 5000, hemoglobin is down to 7.9, platelets 162,000.  All of her blood cultures are negative so far.  ASSESSMENT:  She was admitted with sepsis.  It is not totally clear what the source of sepsis is, although she was in my office with urinary tract infection.  Otherwise I would continue with her treatments.  I will probably move her from the ICU when I her see this afternoon.  We will continue with all the other meds in the meantime and follow.  She is to have an echocardiogram today to see if we can get any idea of whether she has vegetations and she has had 2 episodes of sepsis without a definite source on either one.     Katheryne Gorr L.  Juanetta Gosling, M.D.     ELH/MEDQ  D:  06/01/2010  T:  06/01/2010  Job:  [id number]  Electronically Signed by Kari Baars M.D. on 06/03/2010 09:40:08 AM

## 2010-06-03 NOTE — Group Therapy Note (Signed)
  NAMEJUSTIS, DUPAS              ACCOUNT NO.:  [id number]  MEDICAL RECORD NO.:  [id number]           PATIENT TYPE:  I  LOCATION:  IC09                          FACILITY:  [facility]  PHYSICIAN:  Cordel Drewes L. Juanetta Gosling, M.D.DATE OF BIRTH:  May 16, 1950  DATE OF PROCEDURE: DATE OF DISCHARGE:                                PROGRESS NOTE   HISTORY OF PRESENT ILLNESS:  Ms. [name] is overall improved.  She has a blood pressure of about 110.  Her pulse is in the 60s.  She is now on 4 mcg of Levophed.  Her chest is relatively clear but her chest x-ray showed what may be a developing pneumonia on the right after she had had her central line placed.  She is otherwise doing okay and has no other new complaints.  Her chest is as mentioned fairly clear.  Heart is regular.  Abdomen is soft.  Her lab work shows CO2 is 16, BUN 8, creatinine 0.74, calcium is 6.7, white blood count 5900, hemoglobin is 8, hematocrit 23.8, platelets 172,000 but she has had 7 liters of fluid.  ASSESSMENT:  She was septic.  She seems to have improved.  She still may have some metabolic acidemia, so I am going to have her get a chest x- ray today to see what the pneumonia looks like. Continue with her triple antibiotic coverage, cut her IV fluids down to 125 mL/hour, see if we can wean her off of the Levophed, and get a blood gas to see if she does have acidemia.     Tykia Mellone L. Juanetta Gosling, M.D.     ELH/MEDQ  D:  05/31/2010  T:  05/31/2010  Job:  [id number]  Electronically Signed by Kari Baars M.D. on 06/03/2010 09:40:05 AM

## 2010-06-04 LAB — CULTURE, BLOOD (ROUTINE X 2)
Culture: NO GROWTH
Culture: NO GROWTH
Culture: NO GROWTH

## 2010-06-05 ENCOUNTER — Inpatient Hospital Stay (HOSPITAL_COMMUNITY): Payer: BC Managed Care – PPO

## 2010-06-05 LAB — DIFFERENTIAL
Basophils Absolute: 0 10*3/uL (ref 0.0–0.1)
Lymphocytes Relative: 30 % (ref 12–46)
Monocytes Absolute: 0.6 10*3/uL (ref 0.1–1.0)
Monocytes Relative: 10 % (ref 3–12)
Neutro Abs: 3.6 10*3/uL (ref 1.7–7.7)
Neutrophils Relative %: 60 % (ref 43–77)

## 2010-06-05 LAB — CBC
HCT: 23.7 % — ABNORMAL LOW (ref 36.0–46.0)
Hemoglobin: 8.3 g/dL — ABNORMAL LOW (ref 12.0–15.0)
MCH: 27.7 pg (ref 26.0–34.0)
MCHC: 35 g/dL (ref 30.0–36.0)
RBC: 3 MIL/uL — ABNORMAL LOW (ref 3.87–5.11)

## 2010-06-05 LAB — BASIC METABOLIC PANEL
CO2: 28 mEq/L (ref 19–32)
Chloride: 105 mEq/L (ref 96–112)
GFR calc Af Amer: >60 mL/min (ref >60)
Potassium: 3.7 mEq/L (ref 3.5–5.1)
Sodium: 137 mEq/L (ref 135–145)

## 2010-06-08 NOTE — Group Therapy Note (Signed)
  [name], Melissa              ACCOUNT NO.:  [id number]  MEDICAL RECORD NO.:  [id number]           PATIENT TYPE:  I  LOCATION:  A326                          FACILITY:  [facility]  PHYSICIAN:  Lauro Manlove L. Juanetta Gosling, M.D.DATE OF BIRTH:  June 22, 1950  DATE OF PROCEDURE:  06/05/2010 DATE OF DISCHARGE:                                PROGRESS NOTE   [name] was admitted with sepsis.  The definite etiology is not known but she did have a urinary tract infection about 2 days prior to admission in my office and did not get the appropriate antibiotic pill. Her exam shows that her temperature is 98.4, pulse 69, respirations 16, blood pressure 126/72, and O2 sats 99% on room air.  Her chest is much clearer.  She did have infiltrate on chest x-ray.  Her heart is regular. Her abdomen is soft and overall she looks better.  Her CBC shows white count 5900, hemoglobin is 8.3, platelets 208.  Her electrolytes were normal.  Assessment then is that she has sepsis, it is not clear what the source is unless it is a urinary tract source, but she also has some abnormalities on her chest x-ray.  She has anemia which is looks like anemia of chronic disease.  She is much improved but she had a very similar episode without full blown sepsis about a month ago and I am going to want her to get 10 full days of IV antibiotics.  I told if she does not have to stay in the hospital for all of that and she is a little bit anxious about giving antibiotics at home.  I will discuss with case management.     Keison Glendinning L. Juanetta Gosling, M.D.     ELH/MEDQ  D:  06/05/2010  T:  06/05/2010  Job:  [id number]  Electronically Signed by Kari Baars M.D. on 06/08/2010 08:43:44 AM

## 2010-06-08 NOTE — Group Therapy Note (Signed)
  [name], [name]              ACCOUNT NO.:  [id number]  MEDICAL RECORD NO.:  [id number]           PATIENT TYPE:  I  LOCATION:  A326                          FACILITY:  [facility]  PHYSICIAN:  Noura Purpura L. Juanetta Gosling, M.D.DATE OF BIRTH:  1950-12-19  DATE OF PROCEDURE:  06/07/2010 DATE OF DISCHARGE:                                PROGRESS NOTE   [name] is overall much improved.  She has no new complaints.  Her exam, temperature shows that her temperature is 98.3, pulse 68, respirations 18, blood pressure 111/65, O2 sats 100% on room air.  Her chest is much clearer.  Her chest x-ray was improving, but certainly not clear.  Her heart is regular.  She looks overall better.  Assessment then is that she came to the hospital with sepsis.  She had a similar episode about a month ago.  I wanted to have 10 days of antibiotics IV, I think I can switch the Levaquin to taking it by mouth because it is so well absorbed.  She does not feel comfortable with going home and doing IV antibiotics at home. So she is going to need to finish 10 days here in the hospital.  I have discussed this at length with the patient and with case management team.     Oneal Deputy. Juanetta Gosling, M.D.     ELH/MEDQ  D:  06/07/2010  T:  06/07/2010  Job:  [id number]  Electronically Signed by Kari Baars M.D. on 06/08/2010 08:43:49 AM

## 2010-06-08 NOTE — Group Therapy Note (Signed)
  NAMEMARCY, SOOKDEO              ACCOUNT NO.:  [id number]  MEDICAL RECORD NO.:  [id number]           PATIENT TYPE:  I  LOCATION:  A326                          FACILITY:  [facility]  PHYSICIAN:  Elizabeth Paulsen L. Juanetta Gosling, M.D.DATE OF BIRTH:  09/02/1950  DATE OF PROCEDURE:  06/03/2010 DATE OF DISCHARGE:                                PROGRESS NOTE   Ms. Marlar is much improved.  She says she feels better.  She had been moved out of the ICU.  She has no new complaints.  She still feels weak, so I am asking her get up move around some.  Her exam shows that her temperature is 98.1, pulse 52, respirations are 18, blood pressure 123/74, and O2 sats 97% on room air.  Her chest much clearer.  Heart is regular.  Abdomen is soft.  All of her blood cultures thus far show no growth at 4 days, so all that looks better.  ASSESSMENT:  She is much improved.  PLAN:  Continue with her treatments.  I am going to try to get her up and moving around a little bit more.  I am going to repeat a chest x-ray on the 21st.  I do not plan to change anything else.     Daivon Rayos L. Juanetta Gosling, M.D.     ELH/MEDQ  D:  06/03/2010  T:  06/03/2010  Job:  [id number]  Electronically Signed by Kari Baars M.D. on 06/08/2010 08:43:38 AM

## 2010-06-08 NOTE — Group Therapy Note (Signed)
  NAMETRANISHA, TISSUE              ACCOUNT NO.:  [id number]  MEDICAL RECORD NO.:  [id number]           PATIENT TYPE:  I  LOCATION:  A326                          FACILITY:  [facility]  PHYSICIAN:  Catharine Kettlewell L. Juanetta Gosling, M.D.DATE OF BIRTH:  [birth year]  DATE OF PROCEDURE: DATE OF DISCHARGE:                                PROGRESS NOTE   [name] was admitted with sepsis.  It is not clear of the source, although she did have a urinary tract infection in my office.  This morning she says she is doing okay.  Temperature is 98.3, pulse 63, respirations 16, blood pressure 131/70, O2 sats 98% on room air.  Her chest is clear.  Her heart is regular.  Her abdomen is soft. Extremities showed no edema and she overall looks significantly improved.  She has multiple blood cultures that are negative.  ASSESSMENT:  She has sepsis which is of unknown primary source, although I suspect it is urine but I think she is going to need 10 full days of IV antibiotics.  She had a similar presentation about a month ago.  I am going to have get lab work and a chest x-ray in the morning.  We will plan to continue with her IV antibiotics, etc., in the meantime.     [name] L. Juanetta Gosling, M.D.     ELH/MEDQ  D:  06/04/2010  T:  06/04/2010  Job:  [id number]  Electronically Signed by Kari Baars M.D. on 06/08/2010 08:43:41 AM

## 2010-06-08 NOTE — Group Therapy Note (Signed)
  NAMESHELENA, [name]              ACCOUNT NO.:  [id number]  MEDICAL RECORD NO.:  [id number]           PATIENT TYPE:  I  LOCATION:  A326                          FACILITY:  [facility]  PHYSICIAN:  Damyen Knoll L. Juanetta Gosling, M.D.DATE OF BIRTH:  Jul 17, 1950  DATE OF PROCEDURE: DATE OF DISCHARGE:                                PROGRESS NOTE   [name] is feeling better.  She has no new complaints.  I still do not have an echocardiogram report.  I discussed the lack of report with the technologist yesterday and there has been some problem apparently with getting reports from the dictation system into E-chart, so I am going to see if there is something I can do to help out with that.  In the meantime, I will try to talk to the cardiologist to interpret the report.  She feels better.  She has no new complaints.  PHYSICAL EXAMINATION:  VITAL SIGNS:  Her exam shows her temp is 98, pulse is 72, respirations are 16, blood pressure 116/71, O2 sats 99% on room air.  CHEST:  Clear. HEART:  Regular. GENERAL:  She looks much better in general.  Her chest x-ray yesterday showed improvement in the bibasilar opacities.  ASSESSMENT:  She has pneumonia.  She has a urinary tract infection.  She had sepsis syndrome and my plan is to continue with her treatments including her IV antibiotics, try to get the echocardiogram report, discontinue IV fluids, have her try to get up and move around more and follow.     Ziomara Birenbaum L. Juanetta Gosling, M.D.     ELH/MEDQ  D:  06/06/2010  T:  06/06/2010  Job:  [id number]  Electronically Signed by Kari Baars M.D. on 06/08/2010 08:43:46 AM

## 2010-06-09 NOTE — Group Therapy Note (Signed)
  NAMEKORALEE, WEDEKING              ACCOUNT NO.:  [id number]  MEDICAL RECORD NO.:  [id number]           PATIENT TYPE:  I  LOCATION:  A326                          FACILITY:  [facility]  PHYSICIAN:  Sarena Jezek L. Juanetta Gosling, M.D.DATE OF BIRTH:  May 20, 1950  DATE OF PROCEDURE: DATE OF DISCHARGE:  06/08/2010                                PROGRESS NOTE   Ms. Bann is better and will be ready for discharge later today.  Exam shows her temperature is 97.1, pulse 76, respirations 16, blood pressure 101/62, O2 sats 100% on room air.  She looks better.  Says she feels better.  Her appetite is good.  Her chest is much clearer.  Her heart is regular.  Her abdomen is soft.  Extremities showed no edema.  Assessment then is that she has sepsis, probably an urinary source.  She has anemia, multifactorial.  She had the previous history of similar episode.  My assessment then is that she is better.  She is approaching 10 days of IV antibiotics and then she will be discharged home after she receives her antibiotics today.  Please see discharge summary for details.     Antoinette Haskett L. Juanetta Gosling, M.D.     ELH/MEDQ  D:  06/08/2010  T:  06/08/2010  Job:  [id number]  Electronically Signed by Kari Baars M.D. on 06/09/2010 03:33:39 PM

## 2010-06-09 NOTE — Discharge Summary (Signed)
[name], [name]              ACCOUNT NO.:  [id number]  MEDICAL RECORD NO.:  [id number]           PATIENT TYPE:  I  LOCATION:  A326                          FACILITY:  [facility]  PHYSICIAN:  Brenon Antosh L. Juanetta Gosling, M.D.DATE OF BIRTH:  06/19/1950  DATE OF ADMISSION:  05/29/2010 DATE OF DISCHARGE:  05/24/2012LH                              DISCHARGE SUMMARY   FINAL DISCHARGE DIAGNOSES: 1. Probable urosepsis. 2. History of pyelonephritis. 3. Multifactorial anemia. 4. Hypokalemia.  [name] is a 60 year old who came to the emergency room because of fever.  She had been seen in my office, had a urinary tract infection that was sensitive to Levaquin.  I have prescribed Levaquin but when she went to the drug store, she was told that since she was allergic to CIPRO, she could not take Levaquin.  She did not fill that prescription then, did not take anything until the night prior to admission, at which point she took Bactrim which has given her problems with itching in the past and gave her problems with itching again.  At that point, her temperature went up and she came to the emergency room with a temperature of 103.  She was found to be hypotensive.  PHYSICAL EXAMINATION:  GENERAL:  Her exam showed a thin female, in no acute distress. VITAL SIGNS:  Blood pressure was in the 70s, pulse about 120, temperature was 102. CHEST:  Clear. HEART:  Regular without gallop. ABDOMEN:  Soft.  White count was 4700, hemoglobin 10.3, platelets 205.  BMET showed a potassium of 3, BUN was 11.  Creatinine 0.66.  D-dimer was 1.91.  CT of the chest showed atelectasis, but no pulmonary embolus.  HOSPITAL COURSE:  She was treated with IV antibiotics and underwent sepsis protocol in the ICU.  She required Levophed for about 3 days. Her chest x-ray looked more like she had a pneumonia at the time she had central line placed.  She had CT of the abdomen and pelvis which did not show a source of  infection.  HOSPITAL COURSE:  She was treated with triple antibiotic therapy and improved.  She was also given Decadron in the low dose because of her sepsis.  By the time of discharge, she was much improved.  She had finished 10 days of IV antibiotics.  She had a white blood count 3 days prior to admission of 5900, hemoglobin 8.3, and her renal function was normal with a BUN of 8, creatinine of 0.73.  She is going to be discharged home in much improved condition.  PICC line will be removed. She will be on a regular diet.  She is going to be on a multiple vitamin with iron.  She can take Tylenol Extra Strength 1 p.o. q.6 hours p.r.n. pain or fever, Aleve 220 mg as needed for arthritic pain, Phenergan 6.25 mg/5 mL 2 teaspoons q.4 hours p.r.n. nausea, she does not respond well to Zosyn, and Protonix 40 mg daily.  She will be on Levaquin 500 mg daily x7 more days.  She does not tolerate pills particularly well Levaquin is not available as a liquid, so I am  going to have her crushed tablets.  She also uses Alka-Seltzer Plus at home occasionally, she is okay to use that on a p.r.n. basis.  I will also send her home on Decadron 4 mg daily while she is taking the Levaquin.  She will have Home Health Services and had a CBC in about a week.     Mateja Dier L. Juanetta Gosling, M.D.     ELH/MEDQ  D:  06/08/2010  T:  06/08/2010  Job:  [id number]  Electronically Signed by Kari Baars M.D. on 06/09/2010 03:33:36 PM

## 2010-06-12 ENCOUNTER — Emergency Department (HOSPITAL_COMMUNITY): Payer: BC Managed Care – PPO

## 2010-06-12 ENCOUNTER — Emergency Department (HOSPITAL_COMMUNITY)
Admission: EM | Admit: 2010-06-12 | Discharge: 2010-06-13 | Disposition: A | Discharge status: Home / Self Care | Payer: BC Managed Care – PPO | Attending: Emergency Medicine | Admitting: Emergency Medicine

## 2010-06-12 DIAGNOSIS — R109 Unspecified abdominal pain: Secondary | ICD-10-CM | POA: Insufficient documentation

## 2010-06-12 LAB — URINALYSIS, ROUTINE W REFLEX MICROSCOPIC
Glucose, UA: NEGATIVE mg/dL
Hgb urine dipstick: NEGATIVE
Protein, ur: NEGATIVE mg/dL
pH: 5.5 (ref 5.0–8.0)

## 2010-06-19 ENCOUNTER — Ambulatory Visit (HOSPITAL_COMMUNITY)
Admission: RE | Admit: 2010-06-19 | Discharge: 2010-06-19 | Disposition: A | Discharge status: Home / Self Care | Payer: BC Managed Care – PPO | Source: Ambulatory Visit | Attending: Pulmonary Disease | Admitting: Pulmonary Disease

## 2010-06-19 ENCOUNTER — Other Ambulatory Visit (HOSPITAL_COMMUNITY): Payer: Self-pay | Admitting: Pulmonary Disease

## 2010-06-19 DIAGNOSIS — J189 Pneumonia, unspecified organism: Secondary | ICD-10-CM

## 2010-06-19 DIAGNOSIS — R0602 Shortness of breath: Secondary | ICD-10-CM | POA: Insufficient documentation

## 2010-06-21 ENCOUNTER — Ambulatory Visit
Admission: RE | Admit: 2010-06-21 | Discharge: 2010-06-21 | Disposition: A | Discharge status: Home / Self Care | Payer: BC Managed Care – PPO | Source: Ambulatory Visit | Attending: Infectious Diseases | Admitting: Infectious Diseases

## 2010-06-21 ENCOUNTER — Encounter: Payer: Self-pay | Admitting: Infectious Diseases

## 2010-06-21 ENCOUNTER — Ambulatory Visit (INDEPENDENT_AMBULATORY_CARE_PROVIDER_SITE_OTHER): Payer: BC Managed Care – PPO | Admitting: Infectious Diseases

## 2010-06-21 VITALS — BP 122/76 | HR 97 | Temp 98.6°F | Ht 60.0 in | Wt 93.4 lb

## 2010-06-21 DIAGNOSIS — R509 Fever, unspecified: Secondary | ICD-10-CM

## 2010-06-21 LAB — URINALYSIS, ROUTINE W REFLEX MICROSCOPIC
Bilirubin Urine: NEGATIVE
Glucose, UA: NEGATIVE mg/dL
Hgb urine dipstick: NEGATIVE
Protein, ur: NEGATIVE mg/dL

## 2010-06-21 NOTE — Progress Notes (Signed)
  Subjective:    Patient ID: [name], female    DOB: [birth year], 60 y.o.   MRN: [id number]  HPI 60 yo F with recent hospitalizations  She was asymptomatic and until roughly 04-22-10. She then developed fever to 104 and was treated with ceftriaxone and gentamycin. Her UCx was negative. She again had sx around 05-29-10. She received took bactrim prior to admission (which again gave her itching). She was treated with zosyn and getn initially in the hospital. Her Cx grew > 100k enterococcus (R-Tet).  She had CT abd/pelvis 05-30-10  Mild prominence of left ureter versus right though no hydronephrosis is identified. Due to ureteral opacification at time of imaging, unable to exclude urinary tract calculi.  Questionable gallbladder wall thickening versus pericholecystic  fluid, recommend correlation with physical exam and if clinically indicated ultrasound. She was found to have RLL  pneumonia at that admission as well. She was treated with levaquin. Since d/c (06-15-10) she has continued to have fevers- temps go to 100. Feels better than she had previously- her back pain is resolved, she has no dysuria. No cough, has had mild DOE. Has lost 10# during this period. No lymphadenopathy.    Review of Systems  Constitutional: Positive for fever and unexpected weight change. Negative for appetite change.  HENT: Negative for mouth sores.   Respiratory: Positive for shortness of breath. Negative for cough.   Gastrointestinal: Negative for diarrhea and constipation.  Genitourinary: Negative for dysuria, flank pain and difficulty urinating.  Musculoskeletal: Negative for back pain and arthralgias.  Hematological: Negative for adenopathy.       Objective:   Physical Exam  Constitutional:    Eyes: EOM are normal. Pupils are equal, round, and reactive to light.  Neck: Neck supple.  Cardiovascular: Normal rate, regular rhythm and normal heart sounds.   Pulmonary/Chest: Effort normal. No respiratory distress.  She has no wheezes. She has no rales.       Decreased R base  Abdominal: Soft. Bowel sounds are normal. She exhibits no distension. There is no tenderness.  Musculoskeletal: She exhibits no edema.  Lymphadenopathy:    She has no cervical adenopathy.       Right axillary: No pectoral and no lateral adenopathy present.       Left axillary: No pectoral and no lateral adenopathy present.      Right: No supraclavicular adenopathy present.       Left: No supraclavicular adenopathy present.          Assessment & Plan:

## 2010-06-21 NOTE — Progress Notes (Signed)
Addended by: Mariea Clonts D on: 06/21/2010 01:25 PM   Modules accepted: Orders

## 2010-06-21 NOTE — Assessment & Plan Note (Signed)
Would like to be sure that her previous RLL infiltrate has resolved. Will recheck her CXR and if any findings will send her for CT. Will check her CBC as well. I have asked her to stop her levaquin. Given her ? Reaction to cipro this could be a cause as well. She denies hx of TB exposure. Will send a quantiferon gold though, her story would fit this. Will recheck her UA and UCx as well. Will see her back 2 weeks.

## 2010-06-22 ENCOUNTER — Other Ambulatory Visit: Payer: Self-pay | Admitting: Adult Health

## 2010-06-22 ENCOUNTER — Other Ambulatory Visit: Payer: Self-pay | Admitting: Infectious Diseases

## 2010-06-22 ENCOUNTER — Other Ambulatory Visit (INDEPENDENT_AMBULATORY_CARE_PROVIDER_SITE_OTHER): Payer: BC Managed Care – PPO

## 2010-06-22 DIAGNOSIS — R509 Fever, unspecified: Secondary | ICD-10-CM

## 2010-06-22 LAB — URINALYSIS, MICROSCOPIC ONLY
Bacteria, UA: NONE SEEN
Casts: NONE SEEN
Crystals: NONE SEEN

## 2010-06-22 LAB — CBC WITH DIFFERENTIAL/PLATELET
Basophils Absolute: 0 10*3/uL (ref 0.0–0.1)
Basophils Relative: 0 % (ref 0–1)
Eosinophils Relative: 0 % (ref 0–5)
Lymphocytes Relative: 18 % (ref 12–46)
MCHC: 32.9 g/dL (ref 30.0–36.0)
MCV: 82.7 fL (ref 78.0–100.0)
Neutro Abs: 3.3 10*3/uL (ref 1.7–7.7)
Platelets: 294 10*3/uL (ref 150–400)
RDW: 14.5 % (ref 11.5–15.5)
WBC: 4.5 10*3/uL (ref 4.0–10.5)

## 2010-06-23 ENCOUNTER — Telehealth: Payer: Self-pay | Admitting: *Deleted

## 2010-06-23 LAB — BASIC METABOLIC PANEL WITH GFR
Calcium: 9.1 mg/dL (ref 8.4–10.5)
Creat: 0.59 mg/dL (ref 0.50–1.10)
GFR, Est Non African American: >60 mL/min (ref >60)

## 2010-06-23 NOTE — Telephone Encounter (Signed)
Patient notified OK to return to work, per Dr. Hassie Bruce CMA

## 2010-06-23 NOTE — Telephone Encounter (Signed)
Patient scheduled for CT of chest 06/26/10, would like to return to work next week.  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 Wendall Mola CMA

## 2010-06-26 ENCOUNTER — Ambulatory Visit (HOSPITAL_COMMUNITY)
Admission: RE | Admit: 2010-06-26 | Discharge: 2010-06-26 | Disposition: A | Discharge status: Home / Self Care | Payer: BC Managed Care – PPO | Source: Ambulatory Visit | Attending: Infectious Diseases | Admitting: Infectious Diseases

## 2010-06-26 DIAGNOSIS — R509 Fever, unspecified: Secondary | ICD-10-CM

## 2010-06-26 DIAGNOSIS — J984 Other disorders of lung: Secondary | ICD-10-CM | POA: Insufficient documentation

## 2010-06-26 DIAGNOSIS — R0789 Other chest pain: Secondary | ICD-10-CM | POA: Insufficient documentation

## 2010-06-26 MED ORDER — IOHEXOL 300 MG/ML  SOLN
80.0000 mL | Freq: Once | INTRAMUSCULAR | Status: AC | PRN
  Administered 2010-06-26: 80 mL via INTRAVENOUS

## 2010-06-28 ENCOUNTER — Telehealth: Payer: Self-pay | Admitting: *Deleted

## 2010-06-28 NOTE — Telephone Encounter (Signed)
Per Dr. Ninetta Lights patient's CT scan is showing some areas of concern and a referral is being made to Cardiovascular and Thoracic Surgery.  Referral faxed waiting to hear back on appointment date and time. Wendall Mola CMA

## 2010-06-29 ENCOUNTER — Telehealth: Payer: Self-pay | Admitting: *Deleted

## 2010-06-29 NOTE — Telephone Encounter (Signed)
Called patient with appointment information for [facility].  Appointment is 07/04/10 at 11:45 with Dr. Laneta Simmers.  Patient also requested that Dr. Ninetta Lights call her at [phone].  He will call her today. Wendall Mola CMA

## 2010-07-04 ENCOUNTER — Encounter (INDEPENDENT_AMBULATORY_CARE_PROVIDER_SITE_OTHER): Payer: BC Managed Care – PPO | Admitting: Surgery

## 2010-07-04 DIAGNOSIS — D491 Neoplasm of unspecified behavior of respiratory system: Secondary | ICD-10-CM

## 2010-07-05 NOTE — Consult Note (Signed)
NEW PATIENT CONSULTATION  [name], [name] DOB:  1950/08/04                                        July 04, 2010 CHART #:  [id number]  REASON FOR CONSULTATION:  Bilateral lower lobe subpleural airspace consolidation.  CLINICAL HISTORY:  The patient is a 60 year old woman who was admitted to the hospital on April 26, 2010, with probable pyelonephritis having recently been treated for urinary tract infection.  She had chills and fever to 104 with pain in her flank.  She apparently improved with antibiotics and was discharged on April 30, 2010 on Ceftin x10 days. She developed recurrent symptoms of urinary tract infection and was seen back in the office and started on Levaquin by Dr. Juanetta Gosling but then did not take that due to a history of allergy to Cipro.  She was readmitted on May 30, 2010, with urosepsis with high fever and chills as well as hypotension.  She was treated with intravenous antibiotics and required Levophed for about 3 days.  Her chest x-ray showed she developed a right lower lobe pneumonia.  She had a CT scan of the abdomen and pelvis on Jun 13, 2010, which showed some dependent atelectasis in the lung bases with a question of an infiltrate in the posterior right lower lobe.  She was seen back by Dr. Ninetta Lights on June 6 and was still reporting some low- grade temperatures to about 100.  She had mild dyspnea on exertion but was feeling much better.  She had a repeat CT scan of the chest on June 26, 2010, which showed bilateral or lower lobe subpleural airspace consolidation with overall improvement compared to the previous scan of Jun 13, 2010.  There were some areas of internal low attenuation noted by the radiologist that he felt were suggestive of some necrosis.  There was no pleural fluid.  At the present time, the patient said that she has had no further fever or chills.  She has been checking her temperature and highest that she has seen is  99.  She denies any cough or sputum production and has had no hemoptysis.  She said she has been taking some Mucinex but has not noticed any mucus production.  She denies any chest or flank pain.  She denies abdominal pain.  REVIEW OF SYSTEMS:  GENERAL:  She denies fever or chills as noted above. She has had no fatigue.  Her appetite has been improving and she did lose some weight due to her illness. EYES:  Negative. ENT:  Negative. ENDOCRINE:  She denies diabetes and hypothyroidism. CARDIOVASCULAR:  She denies any chest pain or pressure.  She did have mild dyspnea but that is resolved.  She denies PND and orthopnea.  She has had no peripheral edema. RESPIRATORY:  She denies cough and sputum production. GI:  She has had no nausea or vomiting.  She denies melena and bright red blood per rectum. GU:  She denies dysuria and hematuria. NEUROLOGIC:  She denies any focal weakness or numbness.  She denies dizziness and syncope.  She has never had TIA or stroke. HEMATOLOGIC:  She denies any myalgias or arthralgias.  ALLERGIES:  CIPRO which causes lip swelling and BACTRIM which caused itching.  PAST MEDICAL HISTORY:  Significant for urinary tract infection and pyelonephritis as noted above.  She is status post hysterectomy.  SOCIAL HISTORY:  She lives at home with her husband.  She does not smoke or use alcohol.  FAMILY HISTORY:  Negative.  PHYSICAL EXAMINATION:  VITAL SIGNS:  Her blood pressure is 133/88, pulse is 120 and regular, respiratory rate is 16 and unlabored, and oxygen saturation on room air is 100%.  GENERAL:  She looks well.  HEENT: Normocephalic and atraumatic.  Pupils are equal and reactive to light. Extraocular muscles are intact.  Oropharynx is clear.  Teeth are in good condition.  NECK:  Normal carotid pulses bilaterally.  There are no bruits.  There is no adenopathy or thyromegaly.  CARDIAC:  Regular rate and rhythm with normal S1 and S2.  There is no murmur, rub, or  gallop. LUNGS:  Clear.  ABDOMEN:  Active bowel sounds.  Abdomen is soft, flat, and nontender.  There are no palpable masses or organomegaly. EXTREMITIES:  No peripheral edema.  Pedal pulses are palpable bilaterally.  SKIN:  Warm and dry.  NEUROLOGIC:  Alert and oriented x3. Motor and sensory exams are grossly normal.  MEDICATIONS:  Multivitamin daily, Protonix 40 mg daily, and ibuprofen p.r.n. pain.  IMPRESSION:  The patient presented with urosepsis and subsequently developed bilateral lower lobe subpleural consolidation that was relatively mild and limited.  Her CT scan dated June 26, 2010, shows that the findings had improved from her previous CT scan of Jun 13, 2010.  She currently is asymptomatic and I suspect that her chest x-ray findings are lagging behind her clinical response.  Her initial CT scan of the chest did not show any consolidation in these areas and I would expect this to gradually resolve with time.  I do not think there is any reason for intervention at this time.  Clinically, she appears to be doing quite well.  She will continue to follow up with Dr. Johny Sax and Dr. Kari Baars and I think she should probably have a repeat chest x-ray in a month or so to follow up on these densities.  I would expect this to gradually resolve, although she could end up with some chronic scarring in the lung related to this.  Evelene Croon, M.D. Electronically Signed  BB/MEDQ  D:  07/04/2010  T:  07/05/2010  Job:  [id number]  cc:   Lacretia Leigh. Ninetta Lights, M.D. Edward L. Juanetta Gosling, M.D.

## 2010-07-18 ENCOUNTER — Encounter: Payer: Self-pay | Admitting: Infectious Diseases

## 2010-07-18 ENCOUNTER — Ambulatory Visit (INDEPENDENT_AMBULATORY_CARE_PROVIDER_SITE_OTHER): Payer: BC Managed Care – PPO | Admitting: Infectious Diseases

## 2010-07-18 VITALS — BP 127/78 | HR 80 | Temp 98.5°F | Ht 60.0 in | Wt 97.0 lb

## 2010-07-18 DIAGNOSIS — R509 Fever, unspecified: Secondary | ICD-10-CM

## 2010-07-18 MED ORDER — AMOXICILLIN-POT CLAVULANATE 875-125 MG PO TABS: 1.0000 | ORAL_TABLET | Freq: Two times a day (BID) | ORAL | Status: AC

## 2010-07-18 NOTE — Assessment & Plan Note (Signed)
i suspect that her fevers were due to areas of necrotic lung after her pneumonia. She will f/u with Dr Laneta Simmers within the month. Will start her on augmentin, see her back in 1 month.

## 2010-07-18 NOTE — Progress Notes (Signed)
  Subjective:    Patient ID: [name], female    DOB: 01/08/1951, 60 y.o.   MRN: [id number]  HPI 60 yo F who was asymptomatic and until roughly 04-22-10. She then developed fever to 104 and was treated with ceftriaxone and gentamycin. Her UCx was negative. She again had sx around 05-29-10. She received took bactrim prior to admission (which gave her itching). She was treated with zosyn and gent initially in the hospital. Her Cx grew > 100k enterococcus (R-Tet). She had CT abd/pelvis 05-30-10 Mild prominence of left ureter versus right though no hydronephrosis is identified. Due to ureteral opacification at time of imaging, unable to exclude urinary tract calculi. Questionable gallbladder wall thickening versus pericholecystic fluid, recommend correlation with physical exam and if clinically indicated ultrasound. She was found to have RLL pneumonia at that admission as well. She was treated with levaquin. Since d/c (06-15-10) she has continued to have fevers- temps go to 100. She followed up in the ID clinic in June 2012 and had a CT done: Bilateral lower lobe subpleural airspace consolidation, with  overall improvement from 06/13/2010. Areas of internal low attenuation are suggestive of necrosis.  She was sent to the [facility] clinic for eval and was told that she had residual from her prev PNA, does not need surgery.  She feels "pretty good, better than I have in a long time". Still has some occas pain in her back. No fevers, temp has been normal. Continues to have pleuritic chest pain with deep breathing. Had an allergic reaction to levaquin (lips swelling and rash) due to accidentally taking a dose. Her husband has since thrown this away.  Has gained 4#, appetite is good. Taking vitamins and ensure.     Review of Systems     Objective:   Physical Exam  Constitutional: No distress.  Eyes: EOM are normal. Pupils are equal, round, and reactive to light.  Neck: Neck supple.  Cardiovascular: Normal  rate, regular rhythm and normal heart sounds.   Pulmonary/Chest: Effort normal.    Abdominal: Soft. Bowel sounds are normal. She exhibits no distension. There is no tenderness.  Lymphadenopathy:    She has no cervical adenopathy.          Assessment & Plan:

## 2010-08-23 ENCOUNTER — Ambulatory Visit (INDEPENDENT_AMBULATORY_CARE_PROVIDER_SITE_OTHER): Payer: BC Managed Care – PPO | Admitting: Infectious Diseases

## 2010-08-23 ENCOUNTER — Encounter: Payer: Self-pay | Admitting: Infectious Diseases

## 2010-08-23 VITALS — BP 121/72 | HR 83 | Temp 98.1°F | Ht 60.0 in | Wt 98.0 lb

## 2010-08-23 DIAGNOSIS — R509 Fever, unspecified: Secondary | ICD-10-CM

## 2010-08-23 NOTE — Progress Notes (Signed)
  Subjective:    Patient ID: [name], female    DOB: 05-10-1950, 60 y.o.   MRN: [id number]  HPI 60 yo F who was asymptomatic and until roughly 04-22-10. She then developed fever to 104 and was treated with ceftriaxone and gentamycin. Her UCx was negative. She again had sx around 05-29-10. She received took bactrim prior to admission (which gave her itching). She was treated with zosyn and gent initially in the hospital. Her Cx grew > 100k enterococcus (R-Tet). She had CT abd/pelvis 05-30-10 Mild prominence of left ureter versus right though no hydronephrosis is identified. Due to ureteral opacification at time of imaging, unable to exclude urinary tract calculi. Questionable gallbladder wall thickening versus pericholecystic fluid, recommend correlation with physical exam and if clinically indicated ultrasound. She was found to have RLL pneumonia at that admission as well. She was treated with levaquin. Since d/c (06-15-10) she has continued to have fevers- temps go to 100. She followed up in the ID clinic in June 2012 and had a CT done: Bilateral lower lobe subpleural airspace consolidation, with  overall improvement from 06/13/2010. Areas of internal low attenuation are suggestive of necrosis.  She was sent to the [facility]TS clinic for eval and was told that she had residual from her prev PNA, does not need surgery. She was seen in ID clinic 7-3 and was started on augmentin for 1 month.  Has been feeling well. Has occas cough with phlegm which is sometime difficult to produce. She has finished her anbx. Has had no further fevers. Has no chest pain but has some occas lower back/flank pain on the right. Has noticed easy fatigue also, feels like she gets out of breath easily.    Review of Systems     Objective:   Physical Exam  Constitutional: She appears well-developed.  Eyes: EOM are normal.  Neck: Neck supple.  Cardiovascular: Normal rate, regular rhythm and normal heart sounds.   Pulmonary/Chest:  Effort normal and breath sounds normal. She has no wheezes. She has no rales.  Abdominal: Soft. Bowel sounds are normal. There is no tenderness. There is no rebound.  Lymphadenopathy:    She has no cervical adenopathy.  Psychiatric: She has a normal mood and affect. Her behavior is normal. Judgment and thought content normal.       She describes at length an "out of body" experience that she had when she was in the hospital with a fever and low blood pressure.           Assessment & Plan:

## 2010-08-23 NOTE — Assessment & Plan Note (Signed)
She is doing very well. Will see her back in November for a repeat CT of the chest (at her request). She will be off anbx. Will call in the intervening period if she has any further issues. We spent 15 minutes discussing her experience

## 2010-10-17 ENCOUNTER — Other Ambulatory Visit: Payer: Self-pay | Admitting: Obstetrics and Gynecology

## 2010-10-17 DIAGNOSIS — Z1231 Encounter for screening mammogram for malignant neoplasm of breast: Secondary | ICD-10-CM

## 2010-11-15 ENCOUNTER — Ambulatory Visit
Admission: RE | Admit: 2010-11-15 | Discharge: 2010-11-15 | Disposition: A | Discharge status: Home / Self Care | Payer: BC Managed Care – PPO | Source: Ambulatory Visit | Attending: Obstetrics and Gynecology | Admitting: Obstetrics and Gynecology

## 2010-11-15 DIAGNOSIS — Z1231 Encounter for screening mammogram for malignant neoplasm of breast: Secondary | ICD-10-CM

## 2010-11-21 ENCOUNTER — Other Ambulatory Visit: Payer: Self-pay | Admitting: Obstetrics and Gynecology

## 2010-11-21 DIAGNOSIS — R928 Other abnormal and inconclusive findings on diagnostic imaging of breast: Secondary | ICD-10-CM

## 2010-11-22 ENCOUNTER — Ambulatory Visit: Payer: BC Managed Care – PPO | Admitting: Infectious Diseases

## 2010-12-11 ENCOUNTER — Ambulatory Visit
Admission: RE | Admit: 2010-12-11 | Discharge: 2010-12-11 | Disposition: A | Discharge status: Home / Self Care | Payer: BC Managed Care – PPO | Source: Ambulatory Visit | Attending: Obstetrics and Gynecology | Admitting: Obstetrics and Gynecology

## 2010-12-11 DIAGNOSIS — R928 Other abnormal and inconclusive findings on diagnostic imaging of breast: Secondary | ICD-10-CM

## 2010-12-19 ENCOUNTER — Ambulatory Visit: Payer: BC Managed Care – PPO | Admitting: Infectious Diseases

## 2011-02-07 ENCOUNTER — Ambulatory Visit: Payer: BC Managed Care – PPO | Admitting: Infectious Diseases

## 2011-03-19 ENCOUNTER — Ambulatory Visit: Payer: BC Managed Care – PPO | Admitting: Infectious Diseases

## 2011-05-24 ENCOUNTER — Encounter: Payer: Self-pay | Admitting: Internal Medicine

## 2011-05-30 ENCOUNTER — Ambulatory Visit: Payer: BC Managed Care – PPO | Admitting: Gastroenterology

## 2011-07-12 IMAGING — CT CT ABD-PELV W/ CM
2 of 5 series · 16 of 46 positions shown, 18 images · IV contrast (Omnipaque 300)
Comparison: None

CLINICAL DATA: Fever, left flank pain, back and leg pain, question
renal abscess

CT ABDOMEN AND PELVIS WITH CONTRAST
TECHNIQUE: Multidetector CT imaging of the abdomen and pelvis was
performed following the standard protocol during bolus
administration of intravenous contrast. Sagittal and coronal MPR
images reconstructed from axial data set.
Contrast: 50 ml Wmnipaque-V44 IV; Dilute oral contrast.

[Series 2: abd_pel_with 5.0 b40f · axial · 0.59mm/px · z∈[-383,-53]mm · 13 of 76 slices shown, 15 images]
[im 5/76  soft-tissue]
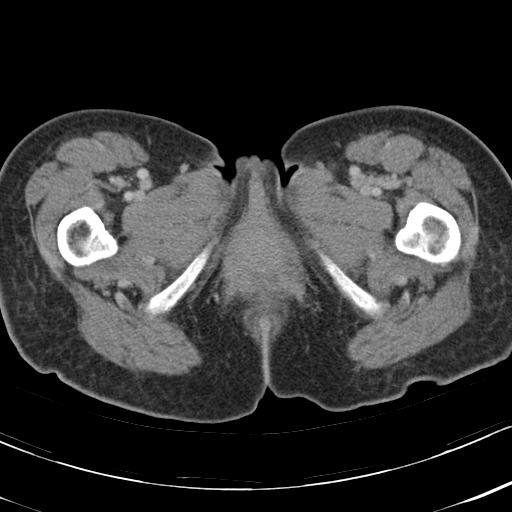
[im 5/76  bone]
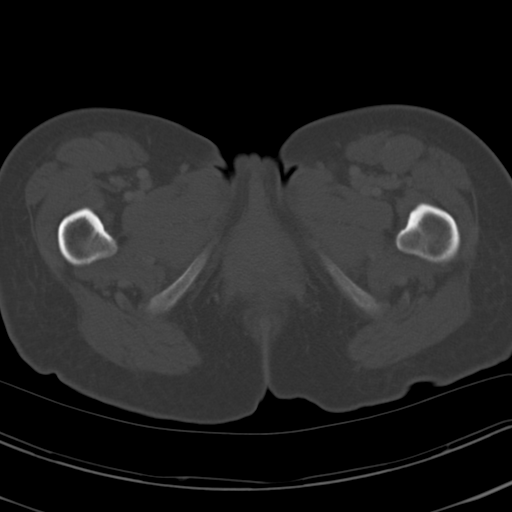
[im 9/76  soft-tissue]
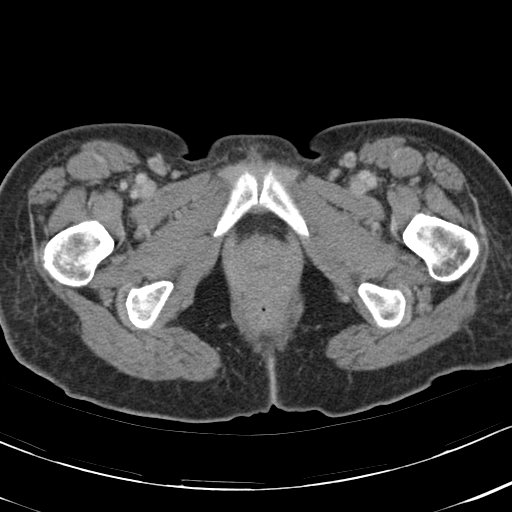
[im 17/76  soft-tissue]
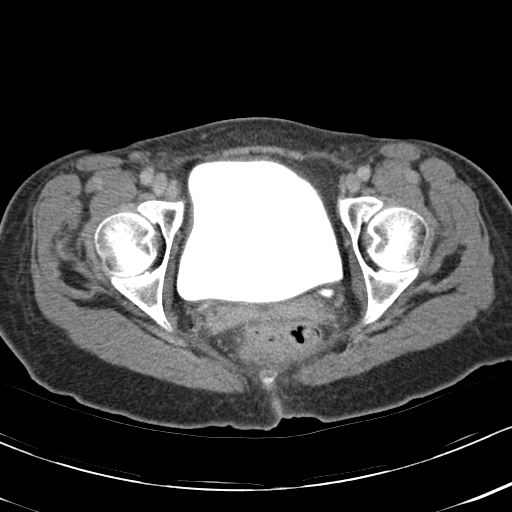
[im 21/76  soft-tissue]
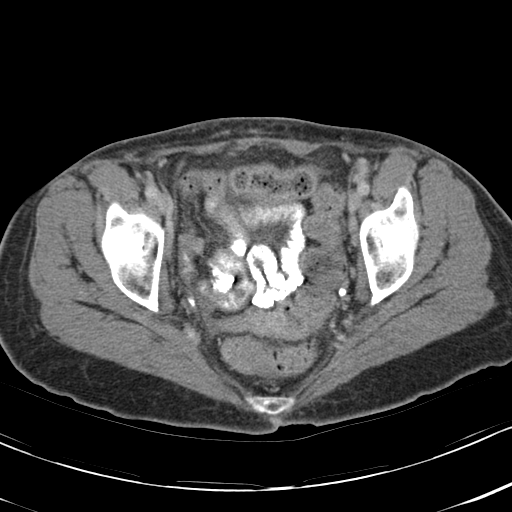
[im 26/76  soft-tissue]
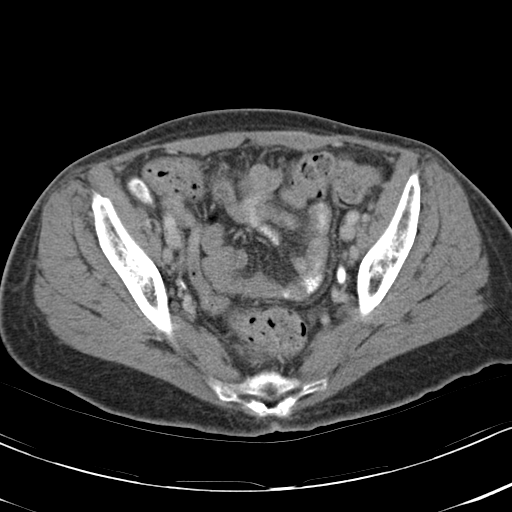
[im 34/76  soft-tissue]
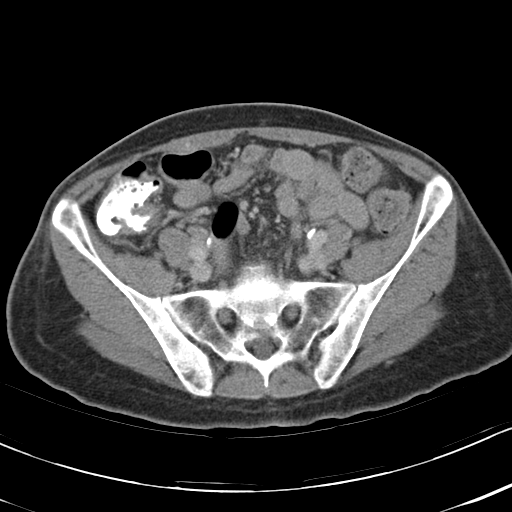
[im 38/76  soft-tissue]
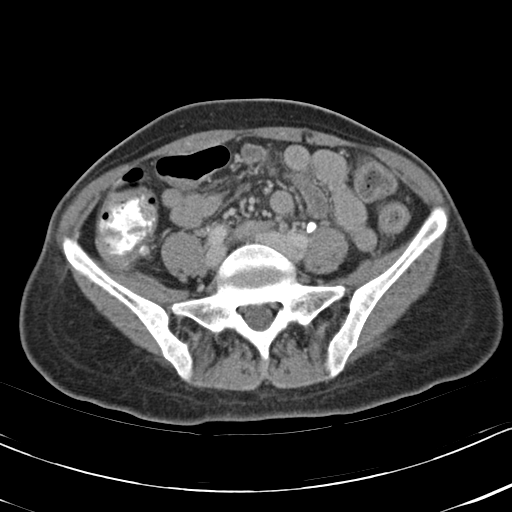
[im 42/76  soft-tissue]
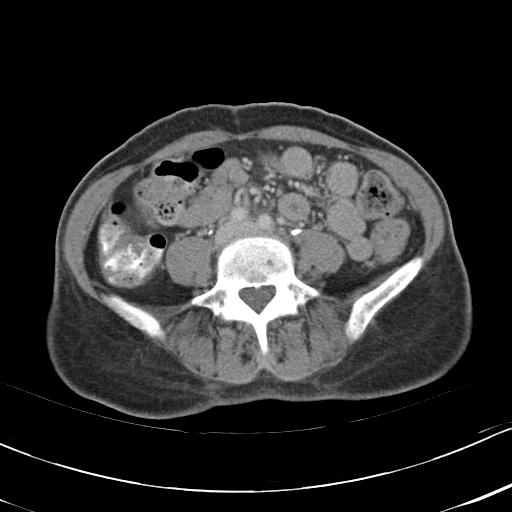
[im 51/76  soft-tissue]
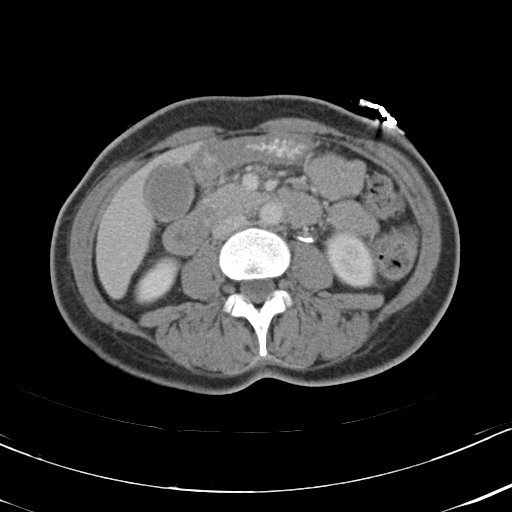
[im 51/76  bone]
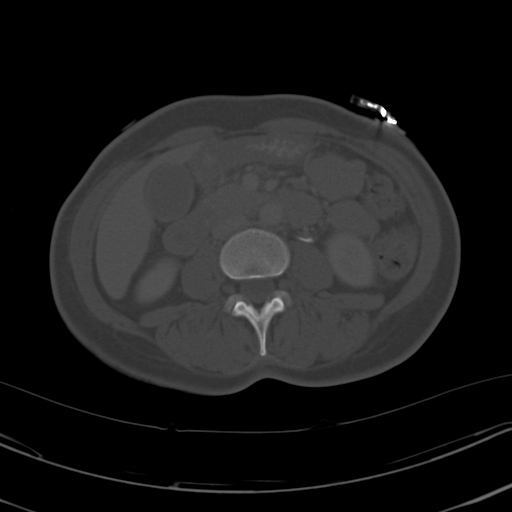
[im 55/76  soft-tissue]
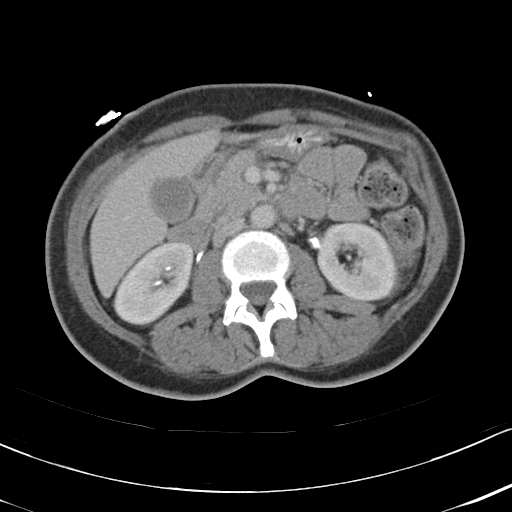
[im 59/76  soft-tissue]
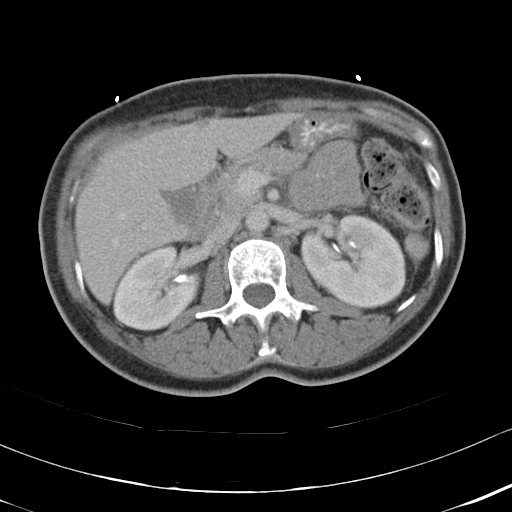
[im 67/76  soft-tissue]
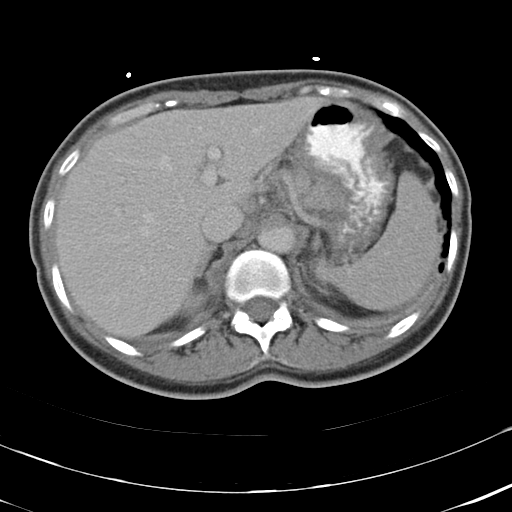
[im 71/76  soft-tissue]
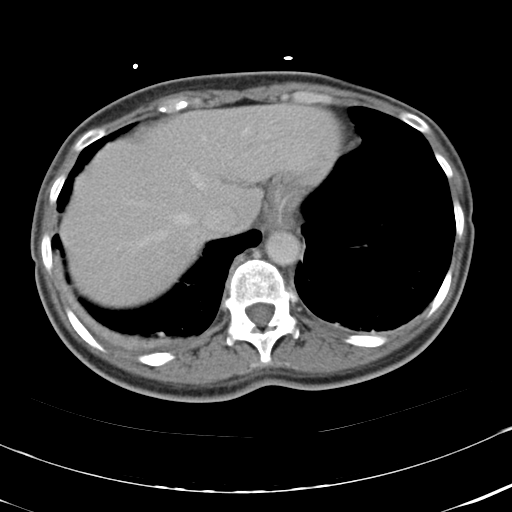

[Series 4: abd_pel_with 3.0 spo cor · coronal · 0.60mm/px · 3 of 71 slices shown]
[im 24/71  soft-tissue]
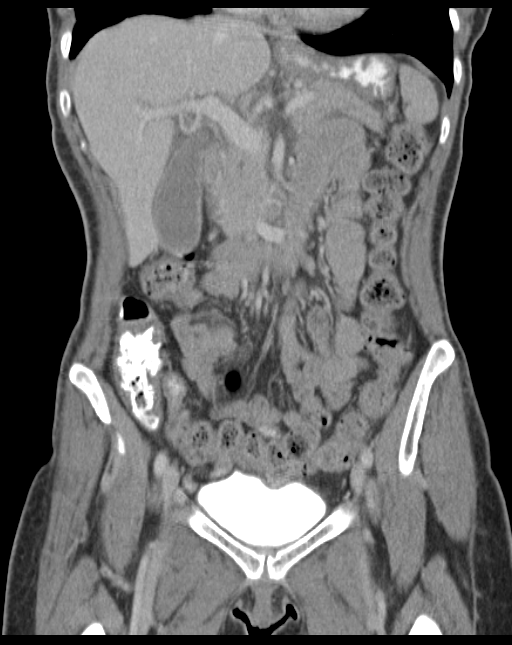
[im 32/71  soft-tissue]
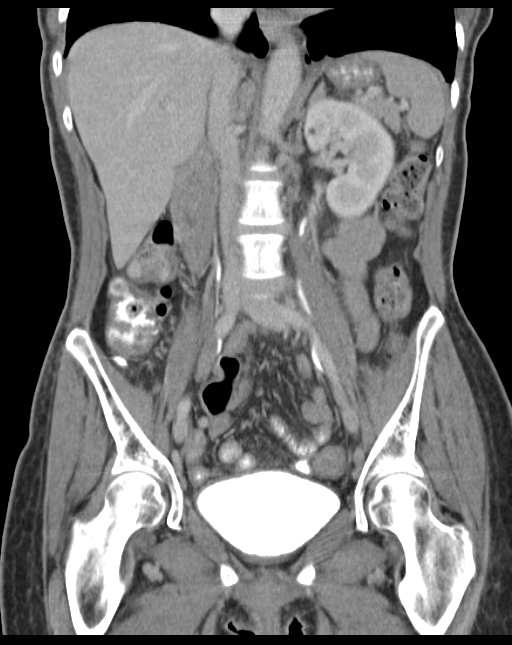
[im 39/71  soft-tissue]
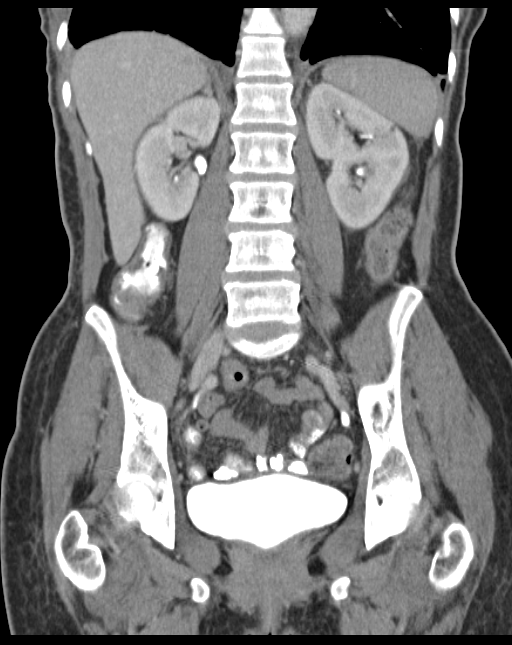

[16 of 46 positions shown; findings below may reference images not displayed]

FINDINGS: Tiny right pleural effusion.
Mild bibasilar atelectasis.
Tiny nonspecific low attenuation foci in left kidney and left lobe
liver, too small to characterize.
Remainder of liver, spleen, pancreas, kidneys, and adrenal glands
normal.
Single ureters bilaterally with mild dilatation of left ureter
versus right, though no left hydronephrosis is seen.
Unable to exclude urinary tract calculi due to presence of excreted
contrast throughout the renal collecting systems, ureters and
bladder.
Bladder well distended by excreted contrast, otherwise
unremarkable.

Uterus surgically absent.
Suboptimal GI contrast secondary to patient vomiting.
No gross abnormalities of large or small bowel identified.
Stomach decompressed.
Question mild pericholecystic fluid versus gallbladder wall
thickening.
No mass, adenopathy, significant free fluid, or abscess collection
identified.
No acute osseous findings.
IMPRESSION: Mild prominence of left ureter versus right though no
hydronephrosis is identified.
Due to ureteral opacification at time of imaging, unable to exclude
urinary tract calculi.
Questionable gallbladder wall thickening versus pericholecystic
fluid, recommend correlation with physical exam and if clinically
indicated ultrasound.
Tiny nonspecific low attenuation foci within liver and left kidney.

## 2011-07-14 IMAGING — CR DG CHEST 1V PORT
1 series · 1 of 1 positions shown · non-contrast
Comparison: 05/31/2010 and earlier.

CLINICAL DATA: 59-year-old female with pneumonia.

PORTABLE CHEST - 1 VIEW

[view not recorded]
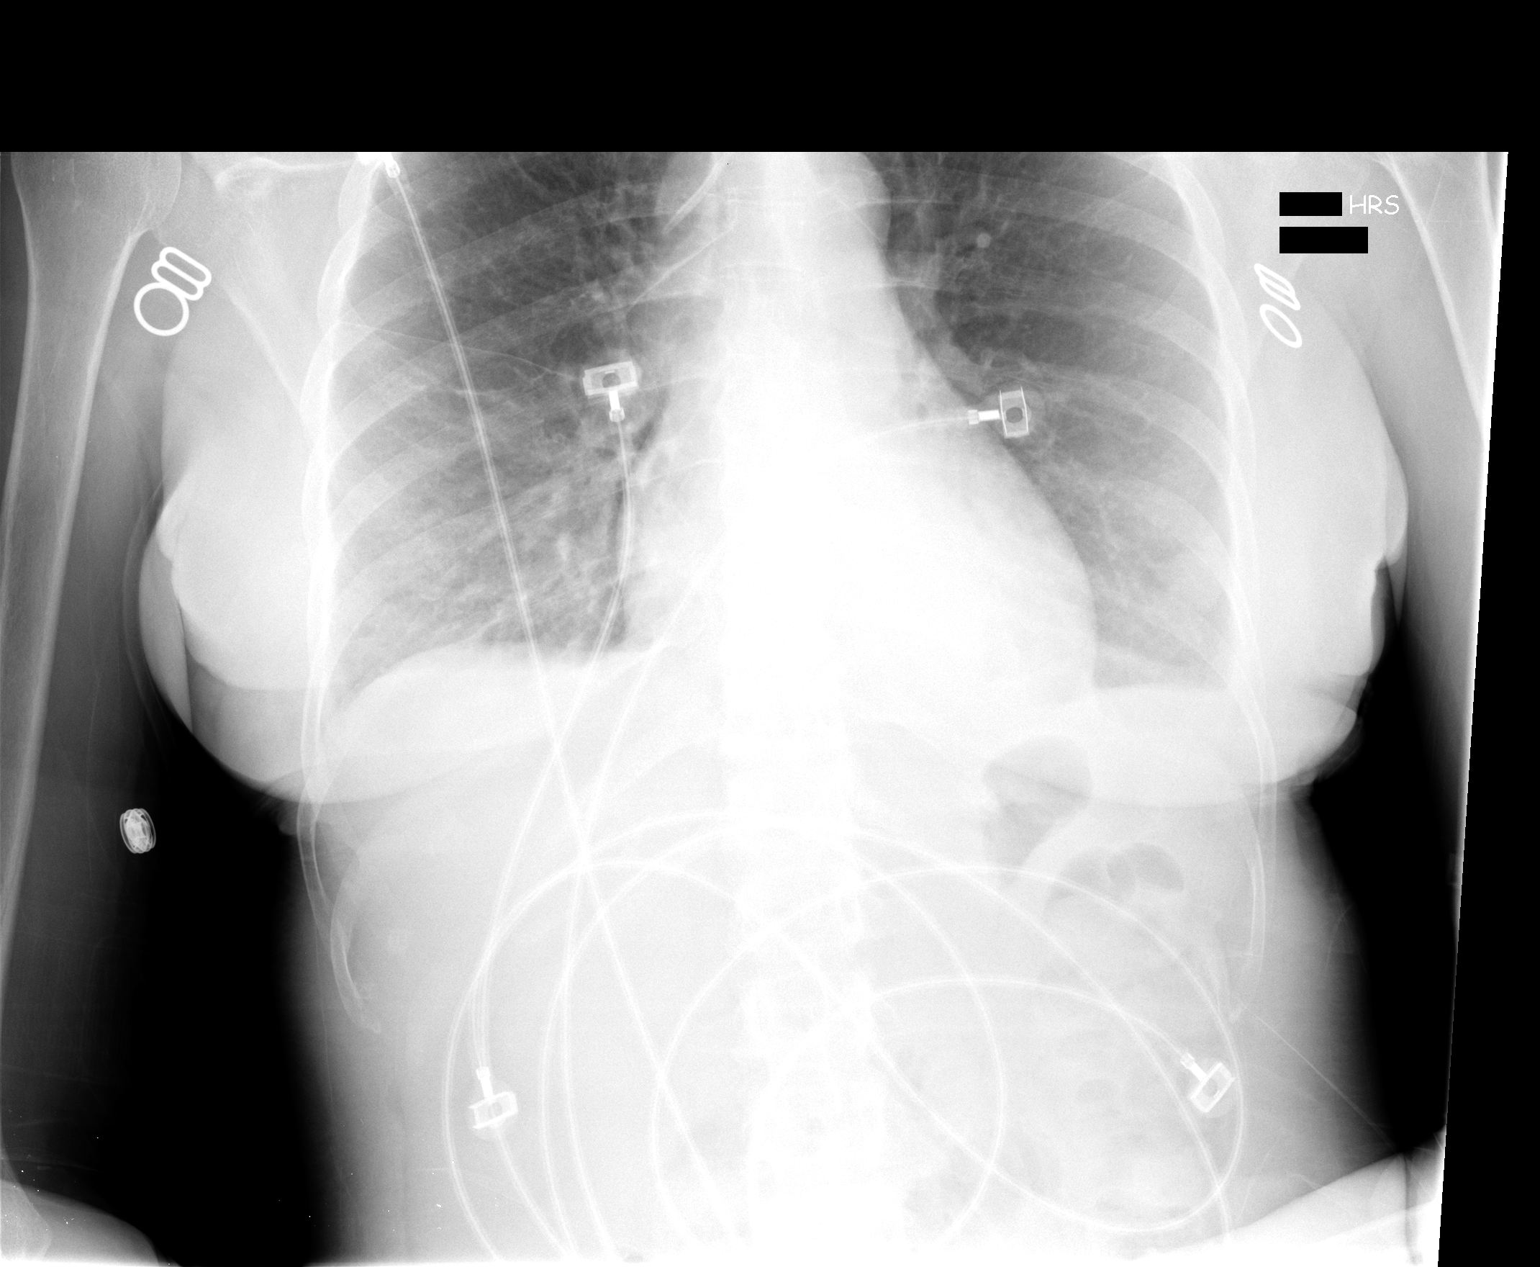

[1 of 1 positions shown; findings below may reference images not displayed]

FINDINGS: Portable upright AP view at 5395 hours. Small bilateral
pleural effusions persist.  Mild patchy bibasilar opacity without
consolidation.  Stable left subclavian approach central line.
Cardiac size and mediastinal contours are within normal limits.  No
pulmonary edema or pneumothorax.  Negative visualized bowel gas
pattern. Several more confluent opacity at the right base is
stable.
IMPRESSION: Small bilateral pleural effusions, bibasilar atelectasis, and
subtle increased right basilar opacity are stable.

## 2011-07-15 IMAGING — CR DG CHEST 1V PORT
1 series · 1 of 1 positions shown · non-contrast
Comparison: Portable exam 1747 hours compared to 06/01/2010

CLINICAL DATA: PICC line placement

PORTABLE CHEST - 1 VIEW

[view not recorded]
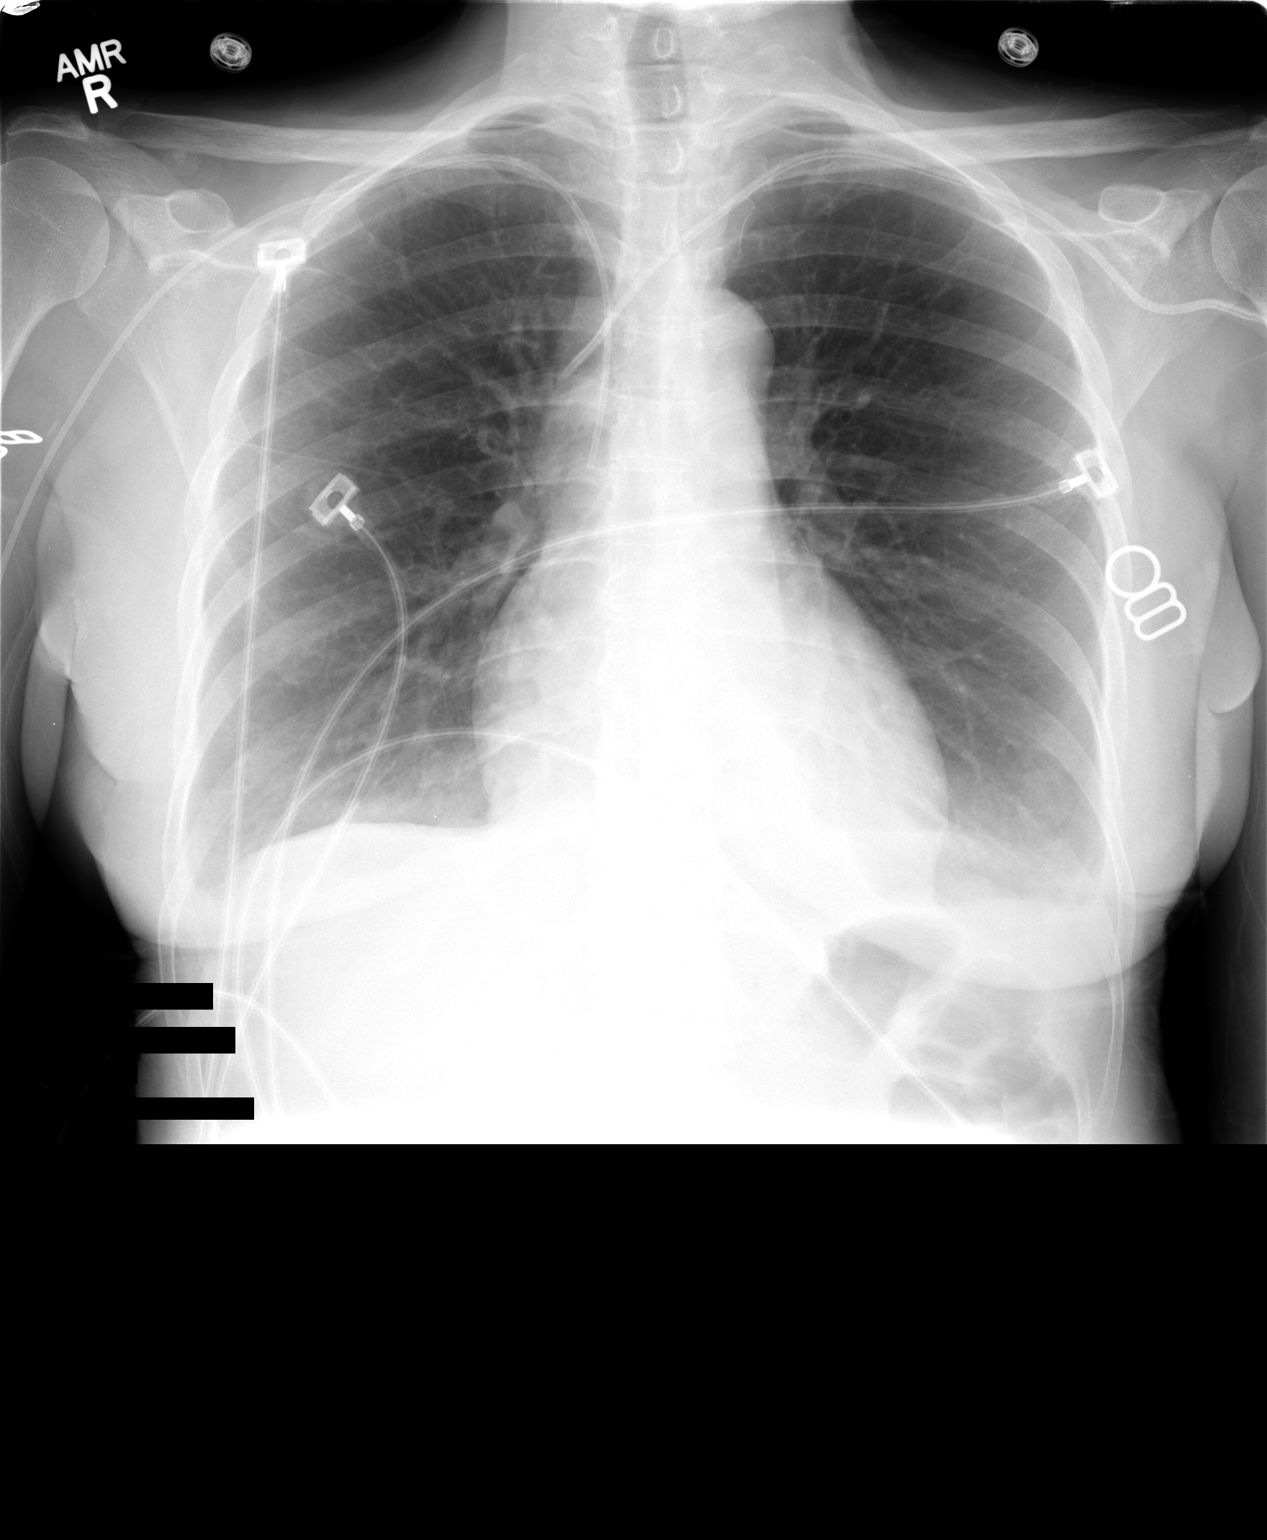

[1 of 1 positions shown; findings below may reference images not displayed]

FINDINGS: New right arm PICC line, tip in SVC at level of carina.
Left subclavian central venous catheter stable, tip SVC.
Stable heart size and mediastinal contours.
Minimal bibasilar atelectasis and questionable tiny effusions.
Upper lungs clear.
Bones unremarkable.
IMPRESSION: Tip of right arm PICC line in SVC.
Minimal bibasilar atelectasis and effusions.

## 2011-07-17 ENCOUNTER — Encounter: Payer: Self-pay | Admitting: *Deleted

## 2011-07-31 ENCOUNTER — Ambulatory Visit: Payer: BC Managed Care – PPO | Admitting: Internal Medicine

## 2011-10-31 ENCOUNTER — Other Ambulatory Visit: Payer: Self-pay | Admitting: Obstetrics and Gynecology

## 2011-10-31 DIAGNOSIS — Z1231 Encounter for screening mammogram for malignant neoplasm of breast: Secondary | ICD-10-CM

## 2011-11-12 ENCOUNTER — Telehealth: Payer: Self-pay

## 2011-11-12 NOTE — Telephone Encounter (Signed)
Pt called and spoke to Soledad Gerlach. Said her mom deceased in 05-02-11 with colon cancer. Her doctor, [facility] recommended she  have another colonoscopy soon. She is also having problems with constipation. OV scheduled with Dr. Darrick Penna on 12/05/2011 at 3:00 PM. ( pt needed late appt due to working).

## 2011-11-13 NOTE — Telephone Encounter (Signed)
REVIEWED.  

## 2011-11-23 ENCOUNTER — Ambulatory Visit
Admission: RE | Admit: 2011-11-23 | Discharge: 2011-11-23 | Disposition: A | Discharge status: Home / Self Care | Payer: BC Managed Care – PPO | Source: Ambulatory Visit | Attending: Obstetrics and Gynecology | Admitting: Obstetrics and Gynecology

## 2011-11-23 DIAGNOSIS — Z1231 Encounter for screening mammogram for malignant neoplasm of breast: Secondary | ICD-10-CM

## 2011-12-05 ENCOUNTER — Other Ambulatory Visit: Payer: Self-pay | Admitting: Gastroenterology

## 2011-12-05 ENCOUNTER — Encounter: Payer: Self-pay | Admitting: Gastroenterology

## 2011-12-05 ENCOUNTER — Ambulatory Visit (INDEPENDENT_AMBULATORY_CARE_PROVIDER_SITE_OTHER): Payer: BC Managed Care – PPO | Admitting: Gastroenterology

## 2011-12-05 VITALS — BP 119/78 | HR 80 | Temp 97.5°F | Ht 61.0 in | Wt 101.6 lb

## 2011-12-05 DIAGNOSIS — Z1211 Encounter for screening for malignant neoplasm of colon: Secondary | ICD-10-CM | POA: Insufficient documentation

## 2011-12-05 DIAGNOSIS — Z800 Family history of malignant neoplasm of digestive organs: Secondary | ICD-10-CM

## 2011-12-05 NOTE — Progress Notes (Signed)
  Subjective:    Patient ID: [name], female    DOB: 10-Sep-1950, 61 y.o.   MRN: [id number]  PCP: [facility]  HPI TCS IN 2007: SLF . MOTHER HAD COLON CA AGER > 60. VOMITED AFTER TAKING HER LAST PREP. LOST HER MOTHER MAR 2013. LOST HER FATHER IN 2012. NO CHANGE IN BOWEL HABITS, BRBPR, OR DIARRHEA. RARE CONSTIPATION.   Past Medical History  Diagnosis Date  . Febrile illness   . Anemia   . Pyelonephritis   . Diverticulosis 2007    Dr Darrick Penna  . Rheumatoid arthritis   . Sickle cell trait     Past Surgical History  Procedure Date  . Abdominal hysterectomy   . Bunionectomy   . Colonoscopy 10/02/2005    SLF: Rare diverticulosis.  Otherwise, no polyps, masses, inflammatory changes or vascular ectasia seen/ Normal retroflexed view of the rectum    Allergies  Allergen Reactions  . Ciprofloxacin   . Sulfa Drugs Cross Reactors     Current Outpatient Prescriptions  Medication Sig Dispense Refill  . acetaminophen (TYLENOL) 500 MG tablet Take 500 mg by mouth every 6 (six) hours as needed.      Marland Kitchen guaiFENesin (MUCINEX) 600 MG 12 hr tablet Take 1,200 mg by mouth daily as needed.      . Multiple Vitamin (MULTIVITAMIN) tablet Take 1 tablet by mouth daily.        . cyclobenzaprine (FLEXERIL) 5 MG tablet Take 5 mg by mouth 3 (three) times daily as needed.        . pantoprazole (PROTONIX) 40 MG tablet Take 40 mg by mouth daily.        . promethazine (PHENERGAN) 6.25 MG/5ML syrup Take by mouth every 4 (four) hours as needed.        . traMADol-acetaminophen (ULTRACET) 37.5-325 MG per tablet Take 1 tablet by mouth every 6 (six) hours as needed.            Review of Systems     Objective:   Physical Exam  Vitals reviewed. Constitutional: She is oriented to person, place, and time. She appears well-nourished. No distress.  HENT:  Head: Normocephalic and atraumatic.  Mouth/Throat: Oropharynx is clear and moist. No oropharyngeal exudate.  Eyes: Pupils are equal, round, and reactive to  light. No scleral icterus.  Neck: Normal range of motion. Neck supple.  Cardiovascular: Normal rate, regular rhythm and normal heart sounds.   Pulmonary/Chest: Effort normal and breath sounds normal. No respiratory distress.  Abdominal: Soft. Bowel sounds are normal. She exhibits no distension. There is no tenderness.  Neurological: She is alert and oriented to person, place, and time.       NO FOCAL DEFICITS   Psychiatric: She has a normal mood and affect.          Assessment & Plan:

## 2011-12-05 NOTE — Assessment & Plan Note (Signed)
MOTHER DIED WITH COLON CA.  TCS NOV 22. HIGH FIBER DIET OPV PRN

## 2011-12-05 NOTE — Patient Instructions (Addendum)
USE METAMUCIL POWDER OR PILLS ONCE A DAY.  COLONOSCOPY NOV 22.  USE PREPOPIK.  FOLLOW A HIGH FIBER DIET. AVOID ITEMS THAT CAUSE BLOATING OR EXTRA GAS. SEE INFO BELOW.  FOLLOW UP AS NEEDED.  High-Fiber Diet A high-fiber diet changes your normal diet to include more whole grains, legumes, fruits, and vegetables. Changes in the diet involve replacing refined carbohydrates with unrefined foods. The calorie level of the diet is essentially unchanged. The Dietary Reference Intake (recommended amount) for adult males is 38 grams per day. For adult females, it is 25 grams per day. Pregnant and lactating women should consume 28 grams of fiber per day. Fiber is the intact part of a plant that is not broken down during digestion. Functional fiber is fiber that has been isolated from the plant to provide a beneficial effect in the body. PURPOSE  Increase stool bulk.   Ease and regulate bowel movements.   Lower cholesterol.  INDICATIONS THAT YOU NEED MORE FIBER  Constipation and hemorrhoids.   Uncomplicated diverticulosis (intestine condition) and irritable bowel syndrome.   Weight management.   As a protective measure against hardening of the arteries (atherosclerosis), diabetes, and cancer.   GUIDELINES FOR INCREASING FIBER IN THE DIET  Start adding fiber to the diet slowly. A gradual increase of about 5 more grams (2 slices of whole-wheat bread, 2 servings of most fruits or vegetables, or 1 bowl of high-fiber cereal) per day is best. Too rapid an increase in fiber may result in constipation, flatulence, and bloating.   Drink enough water and fluids to keep your urine clear or pale yellow. Water, juice, or caffeine-free drinks are recommended. Not drinking enough fluid may cause constipation.   Eat a variety of high-fiber foods rather than one type of fiber.   Try to increase your intake of fiber through using high-fiber foods rather than fiber pills or supplements that contain small  amounts of fiber.   The goal is to change the types of food eaten. Do not supplement your present diet with high-fiber foods, but replace foods in your present diet.  INCLUDE A VARIETY OF FIBER SOURCES  Replace refined and processed grains with whole grains, canned fruits with fresh fruits, and incorporate other fiber sources. White rice, white breads, and most bakery goods contain little or no fiber.   Brown whole-grain rice, buckwheat oats, and many fruits and vegetables are all good sources of fiber. These include: broccoli, Brussels sprouts, cabbage, cauliflower, beets, sweet potatoes, white potatoes (skin on), carrots, tomatoes, eggplant, squash, berries, fresh fruits, and dried fruits.   Cereals appear to be the richest source of fiber. Cereal fiber is found in whole grains and bran. Bran is the fiber-rich outer coat of cereal grain, which is largely removed in refining. In whole-grain cereals, the bran remains. In breakfast cereals, the largest amount of fiber is found in those with "bran" in their names. The fiber content is sometimes indicated on the label.   You may need to include additional fruits and vegetables each day.   In baking, for 1 cup white flour, you may use the following substitutions:   1 cup whole-wheat flour minus 2 tablespoons.   1/2 cup white flour plus 1/2 cup whole-wheat flour.

## 2011-12-06 ENCOUNTER — Encounter (HOSPITAL_COMMUNITY): Payer: Self-pay | Admitting: Pharmacy Technician

## 2011-12-06 NOTE — Progress Notes (Signed)
Faxed to PCP

## 2011-12-07 ENCOUNTER — Ambulatory Visit (HOSPITAL_COMMUNITY)
Admission: RE | Admit: 2011-12-07 | Discharge: 2011-12-07 | Disposition: A | Discharge status: Home / Self Care | Payer: BC Managed Care – PPO | Source: Ambulatory Visit | Attending: Gastroenterology | Admitting: Gastroenterology

## 2011-12-07 ENCOUNTER — Encounter (HOSPITAL_COMMUNITY)
Admission: RE | Disposition: A | Discharge status: Home / Self Care | Payer: Self-pay | Source: Ambulatory Visit | Attending: Gastroenterology

## 2011-12-07 ENCOUNTER — Encounter (HOSPITAL_COMMUNITY): Payer: Self-pay

## 2011-12-07 DIAGNOSIS — K5730 Diverticulosis of large intestine without perforation or abscess without bleeding: Secondary | ICD-10-CM | POA: Insufficient documentation

## 2011-12-07 DIAGNOSIS — Z800 Family history of malignant neoplasm of digestive organs: Secondary | ICD-10-CM

## 2011-12-07 DIAGNOSIS — D126 Benign neoplasm of colon, unspecified: Secondary | ICD-10-CM

## 2011-12-07 DIAGNOSIS — D129 Benign neoplasm of anus and anal canal: Secondary | ICD-10-CM | POA: Insufficient documentation

## 2011-12-07 DIAGNOSIS — Z1211 Encounter for screening for malignant neoplasm of colon: Secondary | ICD-10-CM

## 2011-12-07 DIAGNOSIS — D128 Benign neoplasm of rectum: Secondary | ICD-10-CM | POA: Insufficient documentation

## 2011-12-07 HISTORY — PX: COLONOSCOPY: SHX5424

## 2011-12-07 SURGERY — COLONOSCOPY
Anesthesia: Moderate Sedation

## 2011-12-07 MED ORDER — MIDAZOLAM HCL 5 MG/5ML IJ SOLN
INTRAMUSCULAR | Status: AC
  Filled 2011-12-07: qty 10

## 2011-12-07 MED ORDER — MIDAZOLAM HCL 5 MG/5ML IJ SOLN
INTRAMUSCULAR | Status: DC | PRN
  Administered 2011-12-07: 2 mg via INTRAVENOUS
  Administered 2011-12-07: 1 mg via INTRAVENOUS
  Administered 2011-12-07: 2 mg via INTRAVENOUS

## 2011-12-07 MED ORDER — MEPERIDINE HCL 100 MG/ML IJ SOLN
INTRAMUSCULAR | Status: DC | PRN
  Administered 2011-12-07 (×2): 25 mg via INTRAVENOUS

## 2011-12-07 MED ORDER — SODIUM CHLORIDE 0.45 % IV SOLN: INTRAVENOUS | Status: DC

## 2011-12-07 MED ORDER — SIMETHICONE 40 MG/0.6ML PO SUSP
ORAL | Status: DC | PRN
  Administered 2011-12-07: 13:00:00

## 2011-12-07 MED ORDER — MEPERIDINE HCL 100 MG/ML IJ SOLN
INTRAMUSCULAR | Status: AC
  Filled 2011-12-07: qty 2

## 2011-12-07 NOTE — Interval H&P Note (Signed)
History and Physical Interval Note:  12/07/2011 1:20 PM  [name]  has presented today for surgery, with the diagnosis of FAMILY HX OF COLON CANCER  The various methods of treatment have been discussed with the patient and family. After consideration of risks, benefits and other options for treatment, the patient has consented to  Procedure(s) (LRB) with comments: COLONOSCOPY (N/A) - 3:30 as a surgical intervention .  The patient's history has been reviewed, patient examined, no change in status, stable for surgery.  I have reviewed the patient's chart and labs.  Questions were answered to the patient's satisfaction.     [employer]

## 2011-12-07 NOTE — Op Note (Signed)
[facility] [address] [place], [address]   COLONOSCOPY PROCEDURE REPORT  PATIENT: [name], [name]  MR#: [id number] BIRTHDATE: July 06, 1950 , 61  yrs. old GENDER: Female ENDOSCOPIST: Jonette Eva, MD REFERRED XB:[id number] Tisovec, M.D. PROCEDURE DATE:  12/07/2011 PROCEDURE:   Colonoscopy with snare polypectomy and Colonoscopy with cold biopsy polypectomy INDICATIONS:Patient's immediate family history of colon cancer. MEDICATIONS: Demerol 50 mg IV and Versed 5 mg IV  DESCRIPTION OF PROCEDURE:    Physical exam was performed.  Informed consent was obtained from the patient after explaining the benefits, risks, and alternatives to procedure.  The patient was connected to monitor and placed in left lateral position. Continuous oxygen was provided by nasal cannula and IV medicine administered through an indwelling cannula.  After administration of sedation and rectal exam, the patients rectum was intubated and the EC-[id number] ([id number])  colonoscope was advanced under direct visualization to the cecum.  The scope was removed slowly by carefully examining the color, texture, anatomy, and integrity mucosa on the way out.  The patient was recovered in endoscopy and discharged home in satisfactory condition.       COLON FINDINGS: A smooth pedunculated polyp measuring 6 mm in size was found in the sigmoid colon.  A polypectomy was performed using snare cautery.  , A sessile polyp measuring 3 mm in size was found in the rectum.  A polypectomy was performed with cold forceps.  , and Mild diverticulosis was noted throughout the entire examined colon.    NORMAL RETROFLEXED VIEW OF THE RECTUM.  PREP QUALITY: good. CECAL W/D TIME: 19 minutes  COMPLICATIONS: None  ENDOSCOPIC IMPRESSION: 1.   Pedunculated polyp measuring 6 mm in size was found in the sigmoid colon; polypectomy was performed using snare cautery 2.   Sessile polyp measuring 3 mm in size was found in the  rectum; polypectomy was performed with cold forceps 3.   Mild diverticulosis was noted throughout the entire examined colon   RECOMMENDATIONS: AWAIT BIOPSY HIGH FIBER DIET TCS IN 5 YEARS WITH OVERTUBE       _______________________________ eSignedJonette Eva, MD 12/07/2011 2:28 PM     PATIENT NAME:  [name], [name] MR#: [id number]

## 2011-12-07 NOTE — H&P (View-Only) (Signed)
  Subjective:    Patient ID: [name], female    DOB: 11/05/1950, 61 y.o.   MRN: [id number]  PCP: Tisovec  HPI TCS IN 2007: SLF . MOTHER HAD COLON CA AGER > 60. VOMITED AFTER TAKING HER LAST PREP. LOST HER MOTHER MAR 2013. LOST HER FATHER IN 2012. NO CHANGE IN BOWEL HABITS, BRBPR, OR DIARRHEA. RARE CONSTIPATION.   Past Medical History  Diagnosis Date  . Febrile illness   . Anemia   . Pyelonephritis   . Diverticulosis 2007    Dr Lark Langenfeld  . Rheumatoid arthritis   . Sickle cell trait     Past Surgical History  Procedure Date  . Abdominal hysterectomy   . Bunionectomy   . Colonoscopy 10/02/2005    SLF: Rare diverticulosis.  Otherwise, no polyps, masses, inflammatory changes or vascular ectasia seen/ Normal retroflexed view of the rectum    Allergies  Allergen Reactions  . Ciprofloxacin   . Sulfa Drugs Cross Reactors     Current Outpatient Prescriptions  Medication Sig Dispense Refill  . acetaminophen (TYLENOL) 500 MG tablet Take 500 mg by mouth every 6 (six) hours as needed.      . guaiFENesin (MUCINEX) 600 MG 12 hr tablet Take 1,200 mg by mouth daily as needed.      . Multiple Vitamin (MULTIVITAMIN) tablet Take 1 tablet by mouth daily.        . cyclobenzaprine (FLEXERIL) 5 MG tablet Take 5 mg by mouth 3 (three) times daily as needed.        . pantoprazole (PROTONIX) 40 MG tablet Take 40 mg by mouth daily.        . promethazine (PHENERGAN) 6.25 MG/5ML syrup Take by mouth every 4 (four) hours as needed.        . traMADol-acetaminophen (ULTRACET) 37.5-325 MG per tablet Take 1 tablet by mouth every 6 (six) hours as needed.            Review of Systems     Objective:   Physical Exam  Vitals reviewed. Constitutional: She is oriented to person, place, and time. She appears well-nourished. No distress.  HENT:  Head: Normocephalic and atraumatic.  Mouth/Throat: Oropharynx is clear and moist. No oropharyngeal exudate.  Eyes: Pupils are equal, round, and reactive to  light. No scleral icterus.  Neck: Normal range of motion. Neck supple.  Cardiovascular: Normal rate, regular rhythm and normal heart sounds.   Pulmonary/Chest: Effort normal and breath sounds normal. No respiratory distress.  Abdominal: Soft. Bowel sounds are normal. She exhibits no distension. There is no tenderness.  Neurological: She is alert and oriented to person, place, and time.       NO FOCAL DEFICITS   Psychiatric: She has a normal mood and affect.          Assessment & Plan:   

## 2011-12-12 ENCOUNTER — Telehealth: Payer: Self-pay | Admitting: Gastroenterology

## 2011-12-12 ENCOUNTER — Encounter (HOSPITAL_COMMUNITY): Payer: Self-pay | Admitting: Gastroenterology

## 2011-12-12 NOTE — Telephone Encounter (Signed)
LMOM to call.

## 2011-12-12 NOTE — Telephone Encounter (Signed)
Path faxed to PCP, recall made 

## 2011-12-12 NOTE — Telephone Encounter (Signed)
Please call pt. She had a polypoid lesionS removed FROM HER COLON/RECTUM and THEY ARE Benign. SHE DOES NOT NEED A TCS IN 5 YEARS. SHE IS OKAY TO WIT 10 YEARS SINCE THE POLYPS ARE NOT ADENOMAS. HIGH FIBER DIET.

## 2011-12-18 NOTE — Telephone Encounter (Signed)
LMOM to call.

## 2011-12-18 NOTE — Telephone Encounter (Signed)
Pt returned call and was informed.  

## 2012-05-28 ENCOUNTER — Other Ambulatory Visit: Payer: Self-pay | Admitting: Obstetrics and Gynecology

## 2012-10-23 ENCOUNTER — Other Ambulatory Visit: Payer: Self-pay

## 2012-10-23 DIAGNOSIS — Z1231 Encounter for screening mammogram for malignant neoplasm of breast: Secondary | ICD-10-CM

## 2012-12-03 ENCOUNTER — Ambulatory Visit
Admission: RE | Admit: 2012-12-03 | Discharge: 2012-12-03 | Disposition: A | Discharge status: Home / Self Care | Payer: BC Managed Care – PPO | Source: Ambulatory Visit

## 2012-12-03 DIAGNOSIS — Z1231 Encounter for screening mammogram for malignant neoplasm of breast: Secondary | ICD-10-CM

## 2013-06-03 ENCOUNTER — Other Ambulatory Visit: Payer: Self-pay | Admitting: Obstetrics and Gynecology

## 2013-11-05 ENCOUNTER — Other Ambulatory Visit: Payer: Self-pay

## 2013-11-05 DIAGNOSIS — Z1231 Encounter for screening mammogram for malignant neoplasm of breast: Secondary | ICD-10-CM

## 2013-12-04 ENCOUNTER — Ambulatory Visit
Admission: RE | Admit: 2013-12-04 | Discharge: 2013-12-04 | Disposition: A | Discharge status: Home / Self Care | Payer: BC Managed Care – PPO | Source: Ambulatory Visit

## 2013-12-04 DIAGNOSIS — Z1231 Encounter for screening mammogram for malignant neoplasm of breast: Secondary | ICD-10-CM

## 2014-06-29 ENCOUNTER — Other Ambulatory Visit: Payer: Self-pay | Admitting: Otolaryngology

## 2014-06-29 DIAGNOSIS — H9312 Tinnitus, left ear: Secondary | ICD-10-CM

## 2014-06-29 DIAGNOSIS — H903 Sensorineural hearing loss, bilateral: Secondary | ICD-10-CM

## 2014-06-30 ENCOUNTER — Ambulatory Visit
Admission: RE | Admit: 2014-06-30 | Discharge: 2014-06-30 | Disposition: A | Discharge status: Home / Self Care | Payer: BC Managed Care – PPO | Source: Ambulatory Visit | Attending: Otolaryngology | Admitting: Otolaryngology

## 2014-06-30 DIAGNOSIS — H903 Sensorineural hearing loss, bilateral: Secondary | ICD-10-CM

## 2014-06-30 DIAGNOSIS — H9312 Tinnitus, left ear: Secondary | ICD-10-CM

## 2014-06-30 MED ORDER — GADOBENATE DIMEGLUMINE 529 MG/ML IV SOLN
9.0000 mL | Freq: Once | INTRAVENOUS | Status: AC | PRN
  Administered 2014-06-30: 9 mL via INTRAVENOUS

## 2014-10-10 ENCOUNTER — Emergency Department (HOSPITAL_COMMUNITY)
Admission: EM | Admit: 2014-10-10 | Discharge: 2014-10-10 | Disposition: A | Discharge status: Home / Self Care | Payer: Medicare Other | Attending: Emergency Medicine | Admitting: Emergency Medicine

## 2014-10-10 ENCOUNTER — Encounter (HOSPITAL_COMMUNITY): Payer: Self-pay | Admitting: *Deleted

## 2014-10-10 ENCOUNTER — Emergency Department (HOSPITAL_COMMUNITY): Payer: Medicare Other

## 2014-10-10 DIAGNOSIS — Z79899 Other long term (current) drug therapy: Secondary | ICD-10-CM | POA: Diagnosis not present

## 2014-10-10 DIAGNOSIS — Z8719 Personal history of other diseases of the digestive system: Secondary | ICD-10-CM | POA: Diagnosis not present

## 2014-10-10 DIAGNOSIS — R5383 Other fatigue: Secondary | ICD-10-CM | POA: Diagnosis not present

## 2014-10-10 DIAGNOSIS — R509 Fever, unspecified: Secondary | ICD-10-CM | POA: Diagnosis not present

## 2014-10-10 DIAGNOSIS — R630 Anorexia: Secondary | ICD-10-CM | POA: Diagnosis not present

## 2014-10-10 DIAGNOSIS — R531 Weakness: Secondary | ICD-10-CM | POA: Diagnosis not present

## 2014-10-10 DIAGNOSIS — R110 Nausea: Secondary | ICD-10-CM | POA: Insufficient documentation

## 2014-10-10 DIAGNOSIS — M2550 Pain in unspecified joint: Secondary | ICD-10-CM | POA: Diagnosis not present

## 2014-10-10 DIAGNOSIS — R51 Headache: Secondary | ICD-10-CM | POA: Diagnosis not present

## 2014-10-10 DIAGNOSIS — Z7951 Long term (current) use of inhaled steroids: Secondary | ICD-10-CM | POA: Diagnosis not present

## 2014-10-10 DIAGNOSIS — R42 Dizziness and giddiness: Secondary | ICD-10-CM | POA: Diagnosis not present

## 2014-10-10 DIAGNOSIS — M069 Rheumatoid arthritis, unspecified: Secondary | ICD-10-CM | POA: Diagnosis not present

## 2014-10-10 DIAGNOSIS — Z862 Personal history of diseases of the blood and blood-forming organs and certain disorders involving the immune mechanism: Secondary | ICD-10-CM | POA: Diagnosis not present

## 2014-10-10 DIAGNOSIS — M791 Myalgia: Secondary | ICD-10-CM | POA: Insufficient documentation

## 2014-10-10 DIAGNOSIS — Z87448 Personal history of other diseases of urinary system: Secondary | ICD-10-CM | POA: Diagnosis not present

## 2014-10-10 LAB — URINALYSIS, ROUTINE W REFLEX MICROSCOPIC
Glucose, UA: NEGATIVE mg/dL
HGB URINE DIPSTICK: NEGATIVE
Ketones, ur: 15 mg/dL — AB
Nitrite: NEGATIVE
PROTEIN: NEGATIVE mg/dL
Specific Gravity, Urine: 1.015 (ref 1.005–1.030)
UROBILINOGEN UA: 0.2 mg/dL (ref 0.0–1.0)
pH: 6 (ref 5.0–8.0)

## 2014-10-10 LAB — CBC WITH DIFFERENTIAL/PLATELET
BASOS ABS: 0 10*3/uL (ref 0.0–0.1)
Basophils Relative: 0 %
EOS ABS: 0 10*3/uL (ref 0.0–0.7)
EOS PCT: 0 %
HCT: 30 % — ABNORMAL LOW (ref 36.0–46.0)
Hemoglobin: 10.3 g/dL — ABNORMAL LOW (ref 12.0–15.0)
LYMPHS PCT: 22 %
Lymphs Abs: 1.2 10*3/uL (ref 0.7–4.0)
MCH: 27.2 pg (ref 26.0–34.0)
MCHC: 34.3 g/dL (ref 30.0–36.0)
MCV: 79.4 fL (ref 78.0–100.0)
Monocytes Absolute: 0.5 10*3/uL (ref 0.1–1.0)
Monocytes Relative: 8 %
Neutro Abs: 4 10*3/uL (ref 1.7–7.7)
Neutrophils Relative %: 70 %
PLATELETS: 190 10*3/uL (ref 150–400)
RBC: 3.78 MIL/uL — AB (ref 3.87–5.11)
RDW: 12 % (ref 11.5–15.5)
WBC: 5.6 10*3/uL (ref 4.0–10.5)

## 2014-10-10 LAB — BASIC METABOLIC PANEL
ANION GAP: 6 (ref 5–15)
BUN: 11 mg/dL (ref 6–20)
CHLORIDE: 104 mmol/L (ref 101–111)
CO2: 21 mmol/L — AB (ref 22–32)
Calcium: 7.5 mg/dL — ABNORMAL LOW (ref 8.9–10.3)
Creatinine, Ser: 0.82 mg/dL (ref 0.44–1.00)
GFR calc non Af Amer: >60 mL/min (ref >60)
Glucose, Bld: 99 mg/dL (ref 65–99)
POTASSIUM: 3.4 mmol/L — AB (ref 3.5–5.1)
Sodium: 131 mmol/L — ABNORMAL LOW (ref 135–145)

## 2014-10-10 LAB — TROPONIN I: Troponin I: <0.03 ng/mL (ref <0.031)

## 2014-10-10 LAB — COMPREHENSIVE METABOLIC PANEL
ALBUMIN: 3.6 g/dL (ref 3.5–5.0)
ALT: 14 U/L (ref 14–54)
AST: 27 U/L (ref 15–41)
Alkaline Phosphatase: 32 U/L — ABNORMAL LOW (ref 38–126)
Anion gap: 8 (ref 5–15)
BUN: 12 mg/dL (ref 6–20)
CHLORIDE: 98 mmol/L — AB (ref 101–111)
CO2: 23 mmol/L (ref 22–32)
CREATININE: 0.93 mg/dL (ref 0.44–1.00)
Calcium: 8.3 mg/dL — ABNORMAL LOW (ref 8.9–10.3)
GFR calc Af Amer: >60 mL/min (ref >60)
GFR calc non Af Amer: >60 mL/min (ref >60)
Glucose, Bld: 109 mg/dL — ABNORMAL HIGH (ref 65–99)
Potassium: 3.5 mmol/L (ref 3.5–5.1)
SODIUM: 129 mmol/L — AB (ref 135–145)
Total Bilirubin: 0.4 mg/dL (ref 0.3–1.2)
Total Protein: 8.3 g/dL — ABNORMAL HIGH (ref 6.5–8.1)

## 2014-10-10 LAB — I-STAT CG4 LACTIC ACID, ED: Lactic Acid, Venous: 0.7 mmol/L (ref 0.5–2.0)

## 2014-10-10 LAB — URINE MICROSCOPIC-ADD ON

## 2014-10-10 LAB — RAPID STREP SCREEN (MED CTR MEBANE ONLY): Streptococcus, Group A Screen (Direct): NEGATIVE

## 2014-10-10 MED ORDER — SODIUM CHLORIDE 0.9 % IV BOLUS (SEPSIS)
1000.0000 mL | Freq: Once | INTRAVENOUS | Status: AC
Start: 2014-10-10 — End: 2014-10-10
  Administered 2014-10-10: 1000 mL via INTRAVENOUS

## 2014-10-10 MED ORDER — SODIUM CHLORIDE 0.9 % IV BOLUS (SEPSIS)
1000.0000 mL | Freq: Once | INTRAVENOUS | Status: AC
  Administered 2014-10-10: 1000 mL via INTRAVENOUS

## 2014-10-10 NOTE — ED Provider Notes (Signed)
CSN: [id number]     Arrival date & time 10/10/14  9518 History   First MD Initiated Contact with Patient 10/10/14 [phone]     Chief Complaint  Patient presents with  . Fever     (Consider location/radiation/quality/duration/timing/severity/associated sxs/prior Treatment) HPI Comments: Patient presents with a one-week history of intermittent fever and nausea. States fever in the 99-101 range. Today her fever was 103 and she became more concerned. She took some Tylenol before coming to the ED. She denies any other symptoms. No cough, runny nose. She endorses some "discomfort" in her throat that has been ongoing for the past several weeks. Denies any chest pain or shortness of breath. Denies abdominal pain or vomiting or diarrhea. No recent travel. No sick contacts. She received a flu shot 5 days ago but was feeling sick before that. She was treated by her PCP for acid reflux. She was also seen at urgent care and treated for thrush. She still eating and drinking and having bowel movements. Denies any pain with urination or dysuria. Denies any cough. She endorses body aches with mild headache.  The history is provided by the patient and a relative.    Past Medical History  Diagnosis Date  . Febrile illness   . Anemia   . Pyelonephritis   . Diverticulosis 2007    Dr Oneida Alar  . Rheumatoid arthritis(714.0)   . Sickle cell trait    Past Surgical History  Procedure Laterality Date  . Abdominal hysterectomy    . Bunionectomy    . Colonoscopy  10/02/2005    SLF: Rare diverticulosis.  Otherwise, no polyps, masses, inflammatory changes or vascular ectasia seen/ Normal retroflexed view of the rectum  . Colonoscopy  12/07/2011    Procedure: COLONOSCOPY;  Surgeon: Danie Binder, MD;  Location: [facility];  Service: Endoscopy;  Laterality: N/A;  3:30   Family History  Problem Relation Age of Onset  . Diabetes Father   . Colon cancer Mother    Social History  Substance Use Topics  . Smoking  status: Never Smoker   . Smokeless tobacco: Never Used  . Alcohol Use: No   OB History    No data available     Review of Systems  Constitutional: Positive for fever, activity change, appetite change and fatigue.  HENT: Negative for congestion and rhinorrhea.   Eyes: Negative for visual disturbance.  Respiratory: Negative for cough, chest tightness and shortness of breath.   Gastrointestinal: Positive for nausea. Negative for vomiting and abdominal pain.  Genitourinary: Negative for dysuria, hematuria, vaginal bleeding and vaginal discharge.  Musculoskeletal: Positive for myalgias and arthralgias. Negative for back pain.  Skin: Negative for rash.  Neurological: Positive for weakness, light-headedness and headaches. Negative for dizziness.  A complete 10 system review of systems was obtained and all systems are negative except as noted in the HPI and PMH.      Allergies  Ciprofloxacin; Nsaids; and Sulfa drugs cross reactors  Home Medications   Prior to Admission medications   Medication Sig Start Date End Date Taking? Authorizing Provider  acetaminophen (TYLENOL) 500 MG tablet Take 500 mg by mouth every 6 (six) hours as needed. pain   Yes Historical Provider, MD  BINOSTO 70 MG TBEF Take 1 tablet by mouth once a week. 09/29/14  Yes Historical Provider, MD  dexlansoprazole (DEXILANT) 60 MG capsule Take 60 mg by mouth daily.   Yes Historical Provider, MD  fluconazole (DIFLUCAN) 200 MG tablet Take 200 mg by mouth every  other day. 10/07/14  Yes Historical Provider, MD  fluticasone (FLONASE) 50 MCG/ACT nasal spray Place 1 spray into both nostrils daily.   Yes Historical Provider, MD  guaiFENesin (MUCINEX) 600 MG 12 hr tablet Take 1,200 mg by mouth daily as needed. Cough/congestion   Yes Historical Provider, MD  Multiple Vitamin (MULTIVITAMIN) tablet Take 1 tablet by mouth daily.     Yes Historical Provider, MD  Polyethyl Glycol-Propyl Glycol (SYSTANE OP) Apply 1 drop to eye daily as  needed (dry eyes).   Yes Historical Provider, MD   BP 103/72 mmHg  Pulse 74  Temp(Src) 98.5 F (36.9 C) (Oral)  Resp 20  Ht 5\' 1"  (1.549 m)  Wt 90 lb (40.824 kg)  BMI 17.01 kg/m2  SpO2 100% Physical Exam  Constitutional: She is oriented to person, place, and time. She appears well-developed and well-nourished. No distress.  HENT:  Head: Normocephalic and atraumatic.  Mouth/Throat: Oropharynx is clear and moist. No oropharyngeal exudate.  Oropharynx is clear, no asymmetry, no apparent thrush  Eyes: Conjunctivae and EOM are normal. Pupils are equal, round, and reactive to light.  Neck: Normal range of motion. Neck supple.  No meningismus.  Cardiovascular: Normal rate, regular rhythm, normal heart sounds and intact distal pulses.   No murmur heard. Pulmonary/Chest: Effort normal and breath sounds normal. No respiratory distress. She has no wheezes. She exhibits no tenderness.  Abdominal: Soft. There is no tenderness. There is no rebound and no guarding.  Musculoskeletal: Normal range of motion. She exhibits no edema or tenderness.  Neurological: She is alert and oriented to person, place, and time. No cranial nerve deficit. She exhibits normal muscle tone. Coordination normal.  No ataxia on finger to nose bilaterally. No pronator drift. 5/5 strength throughout. CN 2-12 intact. Negative Romberg. Equal grip strength. Sensation intact. Gait is normal.   Skin: Skin is warm. No rash noted.  Psychiatric: She has a normal mood and affect. Her behavior is normal.  Nursing note and vitals reviewed.   ED Course  Procedures (including critical care time) Labs Review Labs Reviewed  URINALYSIS, ROUTINE W REFLEX MICROSCOPIC (NOT AT [facility]) - Abnormal; Notable for the following:    Bilirubin Urine SMALL (*)    Ketones, ur 15 (*)    Leukocytes, UA TRACE (*)    All other components within normal limits  CBC WITH DIFFERENTIAL/PLATELET - Abnormal; Notable for the following:    RBC 3.78 (*)     Hemoglobin 10.3 (*)    HCT 30.0 (*)    All other components within normal limits  COMPREHENSIVE METABOLIC PANEL - Abnormal; Notable for the following:    Sodium 129 (*)    Chloride 98 (*)    Glucose, Bld 109 (*)    Calcium 8.3 (*)    Total Protein 8.3 (*)    Alkaline Phosphatase 32 (*)    All other components within normal limits  URINE MICROSCOPIC-ADD ON - Abnormal; Notable for the following:    Squamous Epithelial / LPF FEW (*)    All other components within normal limits  BASIC METABOLIC PANEL - Abnormal; Notable for the following:    Sodium 131 (*)    Potassium 3.4 (*)    CO2 21 (*)    Calcium 7.5 (*)    All other components within normal limits  RAPID STREP SCREEN (NOT AT [facility])  CULTURE, GROUP A STREP  CULTURE, BLOOD (ROUTINE X 2)  CULTURE, BLOOD (ROUTINE X 2)  TROPONIN I  I-STAT CG4 LACTIC ACID, ED  Imaging Review Dg Chest 2 View  10/10/2014   CLINICAL DATA:  Fever x1 week, weakness, loss of appetite  EXAM: CHEST  2 VIEW  COMPARISON:  CT chest dated 06/26/2010  FINDINGS: Lungs are clear.  No pleural effusion or pneumothorax.  Nipple shadows overlying the bilateral lower lungs.  The heart is normal in size.  Visualized osseous structures are within normal limits.  IMPRESSION: Normal chest radiographs.   Electronically Signed   By: Julian Hy M.D.   On: 10/10/2014 08:23   I have personally reviewed and evaluated these images and lab results as part of my medical decision-making.   EKG Interpretation   Date/Time:  Sunday October 10 2014 07:20:09 EDT Ventricular Rate:  82 PR Interval:  145 QRS Duration: 79 QT Interval:  354 QTC Calculation: 413 R Axis:   64 Text Interpretation:  Sinus rhythm rate now normal Confirmed by Cartez Mogle   MD, Matthewjames Petrasek ([id number]) on 10/10/2014 7:30:58 AM      MDM   Final diagnoses:  Febrile illness  Weakness   Febrile illness with body aches and chills. Nontoxic appearing vital stable.  UA not impressive for infection.  Heart  rate elevates to 99 with standing and 83 with lying. Patient given IV fluids.  Labs reassuring. Mild hyponatremia 129. Renal function normal  Patient remains afebrile in the ED. She is starting by mouth and ambulatory. Labs reassuring with normal lactate. Urinalysis negative, chest x-ray negative. Suspect viral illness causing fever and weakness.  Chart review shows patient was seen by infectious disease in 2012 for fever of unknown origin. She states this was due to sepsis from pneumonia and she was improved and told she no longer needed ID follow-up.  Blood cultures today up and sent. She is feeling better and tolerant by mouth and ambulatory. Suspect viral febrile illness. Follow-up with PCP this week, return precautions discussed.   Ezequiel Essex, MD 10/10/14 [phone]

## 2014-10-10 NOTE — ED Notes (Signed)
Pt given water for PO challange

## 2014-10-10 NOTE — Discharge Instructions (Signed)
Fever, Adult The workup is negative for serious cause of your fever. Follow up with your doctor this week. Use Tylenol or Motrin as needed for fever. Return to the ED if you develop new or worsening symptoms. A fever is a higher than normal body temperature. In an adult, an oral temperature around 98.6 F (37 C) is considered normal. A temperature of 100.4 F (38 C) or higher is generally considered a fever. Mild or moderate fevers generally have no long-term effects and often do not require treatment. Extreme fever (greater than or equal to 106 F or 41.1 C) can cause seizures. The sweating that may occur with repeated or prolonged fever may cause dehydration. Elderly people can develop confusion during a fever. A measured temperature can vary with:  Age.  Time of day.  Method of measurement (mouth, underarm, rectal, or ear). The fever is confirmed by taking a temperature with a thermometer. Temperatures can be taken different ways. Some methods are accurate and some are not.  An oral temperature is used most commonly. Electronic thermometers are fast and accurate.  An ear temperature will only be accurate if the thermometer is positioned as recommended by the manufacturer.  A rectal temperature is accurate and done for those adults who have a condition where an oral temperature cannot be taken.  An underarm (axillary) temperature is not accurate and not recommended. Fever is a symptom, not a disease.  CAUSES   Infections commonly cause fever.  Some noninfectious causes for fever include:  Some arthritis conditions.  Some thyroid or adrenal gland conditions.  Some immune system conditions.  Some types of cancer.  A medicine reaction.  High doses of certain street drugs such as methamphetamine.  Dehydration.  Exposure to high outside or room temperatures.  Occasionally, the source of a fever cannot be determined. This is sometimes called a "fever of unknown origin"  (FUO).  Some situations may lead to a temporary rise in body temperature that may go away on its own. Examples are:  Childbirth.  Surgery.  Intense exercise. HOME CARE INSTRUCTIONS   Take appropriate medicines for fever. Follow dosing instructions carefully. If you use acetaminophen to reduce the fever, be careful to avoid taking other medicines that also contain acetaminophen. Do not take aspirin for a fever if you are younger than age 79. There is an association with Reye's syndrome. Reye's syndrome is a rare but potentially deadly disease.  If an infection is present and antibiotics have been prescribed, take them as directed. Finish them even if you start to feel better.  Rest as needed.  Maintain an adequate fluid intake. To prevent dehydration during an illness with prolonged or recurrent fever, you may need to drink extra fluid.Drink enough fluids to keep your urine clear or pale yellow.  Sponging or bathing with room temperature water may help reduce body temperature. Do not use ice water or alcohol sponge baths.  Dress comfortably, but do not over-bundle. SEEK MEDICAL CARE IF:   You are unable to keep fluids down.  You develop vomiting or diarrhea.  You are not feeling at least partly better after 3 days.  You develop new symptoms or problems. SEEK IMMEDIATE MEDICAL CARE IF:   You have shortness of breath or trouble breathing.  You develop excessive weakness.  You are dizzy or you faint.  You are extremely thirsty or you are making little or no urine.  You develop new pain that was not there before (such as in the head,  neck, chest, back, or abdomen).  You have persistent vomiting and diarrhea for more than 1 to 2 days.  You develop a stiff neck or your eyes become sensitive to light.  You develop a skin rash.  You have a fever or persistent symptoms for more than 2 to 3 days.  You have a fever and your symptoms suddenly get worse. MAKE SURE YOU:    Understand these instructions.  Will watch your condition.  Will get help right away if you are not doing well or get worse. Document Released: 06/27/2000 Document Revised: 05/18/2013 Document Reviewed: 11/02/2010 [facility] Patient Information 2015 [place], [place]. This information is not intended to replace advice given to you by your health care provider. Make sure you discuss any questions you have with your health care provider.

## 2014-10-10 NOTE — ED Notes (Signed)
Pt c/o fever, intermittent nausea for the past week, denies any pain,

## 2014-10-12 LAB — CULTURE, GROUP A STREP: Strep A Culture: NEGATIVE

## 2014-10-16 LAB — CULTURE, BLOOD (ROUTINE X 2)
Culture: NO GROWTH
Culture: NO GROWTH

## 2014-10-18 ENCOUNTER — Encounter: Payer: Self-pay | Admitting: Gastroenterology

## 2014-11-05 ENCOUNTER — Other Ambulatory Visit: Payer: Self-pay

## 2014-11-05 DIAGNOSIS — Z1231 Encounter for screening mammogram for malignant neoplasm of breast: Secondary | ICD-10-CM

## 2014-12-07 ENCOUNTER — Ambulatory Visit
Admission: RE | Admit: 2014-12-07 | Discharge: 2014-12-07 | Disposition: A | Discharge status: Home / Self Care | Payer: Medicare Other | Source: Ambulatory Visit

## 2014-12-07 DIAGNOSIS — Z1231 Encounter for screening mammogram for malignant neoplasm of breast: Secondary | ICD-10-CM

## 2014-12-13 ENCOUNTER — Encounter: Payer: Self-pay | Admitting: Gastroenterology

## 2014-12-13 ENCOUNTER — Ambulatory Visit (INDEPENDENT_AMBULATORY_CARE_PROVIDER_SITE_OTHER): Payer: Medicare Other | Admitting: Gastroenterology

## 2014-12-13 VITALS — BP 110/60 | HR 70 | Ht 61.0 in | Wt 95.0 lb

## 2014-12-13 DIAGNOSIS — K219 Gastro-esophageal reflux disease without esophagitis: Secondary | ICD-10-CM | POA: Diagnosis not present

## 2014-12-13 DIAGNOSIS — R1310 Dysphagia, unspecified: Secondary | ICD-10-CM

## 2014-12-13 MED ORDER — PANTOPRAZOLE SODIUM 40 MG PO TBEC: 40.0000 mg | DELAYED_RELEASE_TABLET | Freq: Every day | ORAL | Status: DC

## 2014-12-13 NOTE — Progress Notes (Signed)
[name]    JN:[id number]    [birth year]  Primary Care Physician:Richard Sandrea Hughs, MD  Referring Physician: Haywood Pao, MD [address] [place], [place] [address]  Chief complaint:  Jerrye Bushy, dysphagia  HPI:  24 yr F with h/o lupus on plaquenil here with c/o gerd and dysphagia. She has a feeling of something is stuck in her throat with solids and also difficulty with liquids. She does regurgitate liquids if she drinks fast. No h/o food impactions. Denies odynophagia. She had oral candidiasis in the past and no longer has it. Her weight has been stable since she started Plaquenil, she initially lost some weight prior to the diagnosis of lupus. Denies vomiting but has intermittent nausea. No melena or brbpr.   Outpatient Encounter Prescriptions as of 12/13/2014  Medication Sig  . fluticasone (FLONASE) 50 MCG/ACT nasal spray Place 1 spray into both nostrils daily.  . hydroxychloroquine (PLAQUENIL) 200 MG tablet Take 200 mg by mouth daily.  . Multiple Vitamin (MULTIVITAMIN) tablet Take 1 tablet by mouth daily.    Vladimir Faster Glycol-Propyl Glycol (SYSTANE OP) Apply 1 drop to eye daily as needed (dry eyes).  . predniSONE (DELTASONE) 10 MG tablet Take 10 mg by mouth 2 (two) times daily.  . [DISCONTINUED] acetaminophen (TYLENOL) 500 MG tablet Take 500 mg by mouth every 6 (six) hours as needed. pain  . [DISCONTINUED] BINOSTO 70 MG TBEF Take 1 tablet by mouth once a week.  . [DISCONTINUED] dexlansoprazole (DEXILANT) 60 MG capsule Take 60 mg by mouth daily.  . [DISCONTINUED] fluconazole (DIFLUCAN) 200 MG tablet Take 200 mg by mouth every other day.  . [DISCONTINUED] guaiFENesin (MUCINEX) 600 MG 12 hr tablet Take 1,200 mg by mouth daily as needed. Cough/congestion   No facility-administered encounter medications on file as of 12/13/2014.    Allergies as of 12/13/2014 - Review Complete 12/13/2014  Allergen Reaction Noted  . Ciprofloxacin Swelling 06/21/2010  . Nsaids   10/10/2014  . Sulfa drugs cross reactors Other (See Comments) 05/30/2010    Past Medical History  Diagnosis Date  . Febrile illness   . Anemia   . Pyelonephritis   . Diverticulosis 2007    Dr Oneida Alar  . Rheumatoid arthritis(714.0)   . Sickle cell trait ([place])   . Lupus [facility])     Past Surgical History  Procedure Laterality Date  . Abdominal hysterectomy    . Bunionectomy    . Colonoscopy  10/02/2005    SLF: Rare diverticulosis.  Otherwise, no polyps, masses, inflammatory changes or vascular ectasia seen/ Normal retroflexed view of the rectum  . Colonoscopy  12/07/2011    Procedure: COLONOSCOPY;  Surgeon: Danie Binder, MD;  Location: [facility];  Service: Endoscopy;  Laterality: N/A;  3:30    Family History  Problem Relation Age of Onset  . Diabetes Father   . Colon cancer Mother   . Hypertension      two brothers, sister     Social History   Social History  . Marital Status: Married    Spouse Name: N/A  . Number of Children: 3  . Years of Education: N/A   Occupational History  . retired    Social History Main Topics  . Smoking status: Never Smoker   . Smokeless tobacco: Never Used  . Alcohol Use: 0.0 oz/week    0 Standard drinks or equivalent per week     Comment: occasional glass of wine with dinner  .  Drug Use: No  . Sexual Activity: Yes    Birth Control/ Protection: Surgical   Other Topics Concern  . Not on file   Social History Narrative      Review of systems: Review of Systems  Constitutional: Negative for fever and chills.  HENT: Negative.   Eyes: Negative for blurred vision.  Respiratory: Negative for cough, shortness of breath and wheezing.   Cardiovascular: Negative for chest pain and palpitations.  Gastrointestinal: as per HPI Genitourinary: Negative for dysuria, urgency, frequency and hematuria.  Musculoskeletal: Negative for myalgias, back pain and joint pain.  Skin: Negative for itching and rash.  Neurological: Negative for  dizziness, tremors, focal weakness, seizures and loss of consciousness.  Endo/Heme/Allergies: Negative for environmental allergies.  Psychiatric/Behavioral: Negative for depression, suicidal ideas and hallucinations.  All other systems reviewed and are negative.   Physical Exam: Filed Vitals:   12/13/14 1358  BP: 110/60  Pulse: 70   Gen:      No acute distress HEENT:  EOMI, sclera anicteric Neck:     No masses; no thyromegaly Lungs:    Clear to auscultation bilaterally; normal respiratory effort CV:         Regular rate and rhythm; no murmurs Abd:      + bowel sounds; soft, non-tender; no palpable masses, no distension Ext:    No edema; adequate peripheral perfusion Skin:      Warm and dry; no rash Neuro: alert and oriented x 3 Psych: normal mood and affect  Data Reviewed:  Colonoscopy 2013: 2 polyps removed from sigmoid colon and rectum (pathology lymphoid aggregate and hyperplastic polyp)   Assessment and Plan/Recommendations:  46 yr F with h/o lupus on plaquenil here to establish care with c/o dysphagia and GERD symptoms She is currently not on any acid suppression, Start PPI daily Discussed anti reflux measures Will schedule for EGD to evaluate for possible stricture/ring or esophagitis Due for recall colonoscopy in 2018 (1st degree relative with colon ca) Return after EGD  K. Denzil Magnuson , MD [phone] Mon-Fri 8a-5p [phone] after 5p, weekends, holidays

## 2014-12-13 NOTE — Patient Instructions (Signed)
You have been scheduled for an endoscopy. Please follow written instructions given to you at your visit today. If you use inhalers (even only as needed), please bring them with you on the day of your procedure. Your physician has requested that you go to www.startemmi.com and enter the access code given to you at your visit today. This web site gives a general overview about your procedure. However, you should still follow specific instructions given to you by our office regarding your preparation for the procedure.  Your follow up appointment with Dr Silverio Decamp is scheduled on 02-14-2015 at 2:45pm

## 2014-12-17 ENCOUNTER — Encounter: Payer: Self-pay | Admitting: Gastroenterology

## 2014-12-17 ENCOUNTER — Ambulatory Visit (AMBULATORY_SURGERY_CENTER): Payer: Medicare Other | Admitting: Gastroenterology

## 2014-12-17 VITALS — BP 110/68 | HR 85 | Temp 99.1°F | Resp 18 | Ht 61.0 in | Wt 95.0 lb

## 2014-12-17 DIAGNOSIS — R1310 Dysphagia, unspecified: Secondary | ICD-10-CM | POA: Diagnosis present

## 2014-12-17 MED ORDER — SODIUM CHLORIDE 0.9 % IV SOLN: 500.0000 mL | INTRAVENOUS | Status: DC

## 2014-12-17 NOTE — Op Note (Signed)
[facility]  [address] [place], [id number]   ENDOSCOPY PROCEDURE REPORT  PATIENT: [name], [name]  MR#: [id number] BIRTHDATE: 08/29/1950 , 70  yrs. old GENDER: female ENDOSCOPIST: Harl Bowie, MD REFERRED BY:  Domenick Gong, M.D. PROCEDURE DATE:  12/17/2014 PROCEDURE:  EGD w/ balloon dilation and EGD w/ biopsy ASA CLASS:     Class II INDICATIONS:  dysphagia. MEDICATIONS: Propofol 200 mg IV TOPICAL ANESTHETIC: none  DESCRIPTION OF PROCEDURE: After the risks benefits and alternatives of the procedure were thoroughly explained, informed consent was obtained.  The LB [id number] endoscope was introduced through the mouth and advanced to the second portion of the duodenum , Without limitations.  The instrument was slowly withdrawn as the mucosa was fully examined.   Esophageal mucosa appeared normal, regular Z line at 34 cm.  no visible stricture or ring in distal esophagus.  Empiric balloon dilation was performed with 18 mm, 19 mm and 20 mm.  No heme was noted post dilation.  Random biopsies obtained from distal and proximal esophagus to rule out eosinophilic esophagitis.  gastric mucosa appeared normal and duodenum appeared normal.  Retroflexed views revealed no abnormalities.     The scope was then withdrawn from the patient and the procedure completed.  COMPLICATIONS: There were no immediate complications.  ENDOSCOPIC IMPRESSION: Normal upper endoscopy Esophageal biopsies obtained to rule out eosinophilic esophagitis Empiric balloon dilation performed to 20 mm  RECOMMENDATIONS: 1.  Await biopsy results 2.  Continue PPI 3.  Anti-reflux regimen to be follow 4.  Esophageal manometry and follow up in office visit   eSigned:  Harl Bowie, MD 12/17/2014 9:43 AM

## 2014-12-17 NOTE — Progress Notes (Signed)
A/ox3 pleased with MAC, report to Jane RN 

## 2014-12-17 NOTE — Patient Instructions (Signed)
YOU HAD AN ENDOSCOPIC PROCEDURE TODAY AT THE [facility]:   Refer to the procedure report that was given to you for any specific questions about what was found during the examination.  If the procedure report does not answer your questions, please call your gastroenterologist to clarify.  If you requested that your care partner not be given the details of your procedure findings, then the procedure report has been included in a sealed envelope for you to review at your convenience later.  YOU SHOULD EXPECT: Some feelings of bloating in the abdomen. Passage of more gas than usual.  Walking can help get rid of the air that was put into your GI tract during the procedure and reduce the bloating. If you had a lower endoscopy (such as a colonoscopy or flexible sigmoidoscopy) you may notice spotting of blood in your stool or on the toilet paper. If you underwent a bowel prep for your procedure, you may not have a normal bowel movement for a few days.  Please Note:  You might notice some irritation and congestion in your nose or some drainage.  This is from the oxygen used during your procedure.  There is no need for concern and it should clear up in a day or so.  SYMPTOMS TO REPORT IMMEDIATELY:   Following lower endoscopy (colonoscopy or flexible sigmoidoscopy):  Excessive amounts of blood in the stool  Significant tenderness or worsening of abdominal pains  Swelling of the abdomen that is new, acute  Fever of 100F or higher   Following upper endoscopy (EGD)  Vomiting of blood or coffee ground material  New chest pain or pain under the shoulder blades  Painful or persistently difficult swallowing  New shortness of breath  Fever of 100F or higher  Black, tarry-looking stools  For urgent or emergent issues, a gastroenterologist can be reached at any hour by calling [phone].   DIET: Your first meal following the procedure should be a small meal and then it is ok to progress to  your normal diet. Heavy or fried foods are harder to digest and may make you feel nauseous or bloated.  Likewise, meals heavy in dairy and vegetables can increase bloating.  Drink plenty of fluids but you should avoid alcoholic beverages for 24 hours.  ACTIVITY:  You should plan to take it easy for the rest of today and you should NOT DRIVE or use heavy machinery until tomorrow (because of the sedation medicines used during the test).    FOLLOW UP: Our staff will call the number listed on your records the next business day following your procedure to check on you and address any questions or concerns that you may have regarding the information given to you following your procedure. If we do not reach you, we will leave a message.  However, if you are feeling well and you are not experiencing any problems, there is no need to return our call.  We will assume that you have returned to your regular daily activities without incident.  If any biopsies were taken you will be contacted by phone or by letter within the next 1-3 weeks.  Please call us at [phone] if you have not heard about the biopsies in 3 weeks.    SIGNATURES/CONFIDENTIALITY: You and/or your care partner have signed paperwork which will be entered into your electronic medical record.  These signatures attest to the fact that that the information above on your After Visit Summary has been reviewed   and is understood.  Full responsibility of the confidentiality of this discharge information lies with you and/or your care-partner.YOU HAD AN ENDOSCOPIC PROCEDURE TODAY AT [facility]:   Refer to the procedure report that was given to you for any specific questions about what was found during the examination.  If the procedure report does not answer your questions, please call your gastroenterologist to clarify.  If you requested that your care partner not be given the details of your procedure findings, then the procedure  report has been included in a sealed envelope for you to review at your convenience later.  YOU SHOULD EXPECT: Some feelings of bloating in the abdomen. Passage of more gas than usual.  Walking can help get rid of the air that was put into your GI tract during the procedure and reduce the bloating. If you had a lower endoscopy (such as a colonoscopy or flexible sigmoidoscopy) you may notice spotting of blood in your stool or on the toilet paper. If you underwent a bowel prep for your procedure, you may not have a normal bowel movement for a few days.  Please Note:  You might notice some irritation and congestion in your nose or some drainage.  This is from the oxygen used during your procedure.  There is no need for concern and it should clear up in a day or so.  SYMPTOMS TO REPORT IMMEDIATELY:   Following lower endoscopy (colonoscopy or flexible sigmoidoscopy):  Excessive amounts of blood in the stool  Significant tenderness or worsening of abdominal pains  Swelling of the abdomen that is new, acute  Fever of 100F or higher   Following upper endoscopy (EGD)  Vomiting of blood or coffee ground material  New chest pain or pain under the shoulder blades  Painful or persistently difficult swallowing  New shortness of breath  Fever of 100F or higher  Black, tarry-looking stools  For urgent or emergent issues, a gastroenterologist can be reached at any hour by calling [phone].   DIET: Your first meal following the procedure should be a small meal and then it is ok to progress to your normal diet. Heavy or fried foods are harder to digest and may make you feel nauseous or bloated.  Likewise, meals heavy in dairy and vegetables can increase bloating.  Drink plenty of fluids but you should avoid alcoholic beverages for 24 hours.  ACTIVITY:  You should plan to take it easy for the rest of today and you should NOT DRIVE or use heavy machinery until tomorrow (because of the sedation  medicines used during the test).    FOLLOW UP: Our staff will call the number listed on your records the next business day following your procedure to check on you and address any questions or concerns that you may have regarding the information given to you following your procedure. If we do not reach you, we will leave a message.  However, if you are feeling well and you are not experiencing any problems, there is no need to return our call.  We will assume that you have returned to your regular daily activities without incident.  If any biopsies were taken you will be contacted by phone or by letter within the next 1-3 weeks.  Please call us at [phone] if you have not heard about the biopsies in 3 weeks.    SIGNATURES/CONFIDENTIALITY: You and/or your care partner have signed paperwork which will be entered into your electronic medical record.  These signatures attest to the fact that that the information above on your After Visit Summary has been reviewed and is understood.  Full responsibility of the confidentiality of this discharge information lies with you and/or your care-partner.  Anti reflux regimen given.  Continue PPI    After dilation diet given with instructions.   To be scheduled for esophageal manometry and office visit.

## 2014-12-17 NOTE — Progress Notes (Signed)
Dental advisory given to patient 

## 2014-12-17 NOTE — Progress Notes (Signed)
Called to room to assist during endoscopic procedure.  Patient ID and intended procedure confirmed with present staff. Received instructions for my participation in the procedure from the performing physician.  

## 2014-12-20 ENCOUNTER — Telehealth: Payer: Self-pay | Admitting: Emergency Medicine

## 2014-12-20 NOTE — Telephone Encounter (Signed)
  Follow up Call-  Call back number 12/17/2014  Post procedure Call Back phone  # [phone]  Permission to leave phone message Yes     Patient questions:  Do you have a fever, pain , or abdominal swelling? No. Pain Score  0 *  Have you tolerated food without any problems? Yes.    Have you been able to return to your normal activities? Yes.    Do you have any questions about your discharge instructions: Diet   No. Medications  No. Follow up visit  No.  Do you have questions or concerns about your Care? No.  Actions: * If pain score is 4 or above: No action needed, pain <4.

## 2014-12-28 ENCOUNTER — Encounter: Payer: Self-pay | Admitting: Gastroenterology

## 2014-12-31 ENCOUNTER — Other Ambulatory Visit: Payer: Self-pay

## 2014-12-31 DIAGNOSIS — R1310 Dysphagia, unspecified: Secondary | ICD-10-CM

## 2015-02-11 ENCOUNTER — Ambulatory Visit (HOSPITAL_COMMUNITY)
Admission: RE | Admit: 2015-02-11 | Discharge: 2015-02-11 | Disposition: A | Discharge status: Home / Self Care | Payer: Medicare Other | Source: Ambulatory Visit | Attending: Gastroenterology | Admitting: Gastroenterology

## 2015-02-11 ENCOUNTER — Encounter (HOSPITAL_COMMUNITY)
Admission: RE | Disposition: A | Discharge status: Home / Self Care | Payer: Self-pay | Source: Ambulatory Visit | Attending: Gastroenterology

## 2015-02-11 DIAGNOSIS — R1310 Dysphagia, unspecified: Secondary | ICD-10-CM | POA: Diagnosis not present

## 2015-02-11 HISTORY — PX: ESOPHAGEAL MANOMETRY: SHX5429

## 2015-02-11 SURGERY — MANOMETRY, ESOPHAGUS
Anesthesia: Choice

## 2015-02-11 MED ORDER — LIDOCAINE VISCOUS 2 % MT SOLN
OROMUCOSAL | Status: AC
  Filled 2015-02-11: qty 15

## 2015-02-11 SURGICAL SUPPLY — 2 items
FACESHIELD LNG OPTICON STERILE (SAFETY) IMPLANT
GLOVE BIO SURGEON STRL SZ8 (GLOVE) ×6 IMPLANT

## 2015-02-11 NOTE — Progress Notes (Signed)
Esophageal Manometry done per protocol. Pt tolerated well. No complications. Report to be sent to Dr. Silverio Decamp.

## 2015-02-14 ENCOUNTER — Encounter (HOSPITAL_COMMUNITY): Payer: Self-pay | Admitting: Gastroenterology

## 2015-02-14 ENCOUNTER — Ambulatory Visit: Payer: Medicare Other | Admitting: Gastroenterology

## 2015-03-16 DIAGNOSIS — R1310 Dysphagia, unspecified: Secondary | ICD-10-CM | POA: Insufficient documentation

## 2015-03-18 ENCOUNTER — Telehealth: Payer: Self-pay

## 2015-03-18 NOTE — Telephone Encounter (Signed)
I have left message for the patient to call back.  Results- normal relaxation of the EG junction.  No significant esophageal peristalic abnormality was detected  She needs a follow up appointment. Can she come on 03/30/15 at 1:45 pm?

## 2015-03-24 NOTE — Telephone Encounter (Signed)
Another message left for the patient about the appointment asking her to confirm.

## 2015-03-28 NOTE — Telephone Encounter (Signed)
Patient re-scheduled her appointment.

## 2015-03-30 ENCOUNTER — Ambulatory Visit: Payer: Medicare Other | Admitting: Gastroenterology

## 2015-06-02 ENCOUNTER — Ambulatory Visit: Payer: Medicare Other | Admitting: Gastroenterology

## 2015-07-11 ENCOUNTER — Ambulatory Visit: Payer: Medicare Other | Admitting: Neurology

## 2015-08-04 ENCOUNTER — Ambulatory Visit: Payer: Medicare Other | Admitting: Gastroenterology

## 2015-08-31 ENCOUNTER — Ambulatory Visit: Payer: Medicare Other | Admitting: Neurology

## 2015-10-05 ENCOUNTER — Ambulatory Visit: Payer: Medicare Other | Admitting: Neurology

## 2015-10-13 ENCOUNTER — Ambulatory Visit: Payer: Medicare Other | Admitting: Gastroenterology

## 2015-10-31 ENCOUNTER — Ambulatory Visit: Payer: Medicare Other | Admitting: Neurology

## 2015-11-03 ENCOUNTER — Ambulatory Visit: Payer: Medicare Other | Admitting: Neurology

## 2015-11-07 ENCOUNTER — Other Ambulatory Visit: Payer: Self-pay | Admitting: Internal Medicine

## 2015-11-07 ENCOUNTER — Other Ambulatory Visit: Payer: Self-pay | Admitting: Obstetrics and Gynecology

## 2015-11-07 DIAGNOSIS — Z1231 Encounter for screening mammogram for malignant neoplasm of breast: Secondary | ICD-10-CM

## 2015-12-12 ENCOUNTER — Ambulatory Visit
Admission: RE | Admit: 2015-12-12 | Discharge: 2015-12-12 | Disposition: A | Discharge status: Home / Self Care | Payer: Medicare Other | Source: Ambulatory Visit | Attending: Internal Medicine | Admitting: Internal Medicine

## 2015-12-12 DIAGNOSIS — Z1231 Encounter for screening mammogram for malignant neoplasm of breast: Secondary | ICD-10-CM

## 2016-02-08 ENCOUNTER — Ambulatory Visit: Payer: Medicare Other | Admitting: Neurology

## 2016-03-14 ENCOUNTER — Ambulatory Visit: Payer: Medicare Other | Admitting: Neurology

## 2016-10-16 ENCOUNTER — Encounter: Payer: Self-pay | Admitting: Gastroenterology

## 2016-11-06 ENCOUNTER — Other Ambulatory Visit: Payer: Self-pay | Admitting: Internal Medicine

## 2016-11-06 DIAGNOSIS — Z1231 Encounter for screening mammogram for malignant neoplasm of breast: Secondary | ICD-10-CM

## 2016-12-03 ENCOUNTER — Ambulatory Visit (AMBULATORY_SURGERY_CENTER): Payer: Self-pay | Admitting: *Deleted

## 2016-12-03 ENCOUNTER — Other Ambulatory Visit: Payer: Self-pay

## 2016-12-03 VITALS — Ht 61.0 in | Wt 105.0 lb

## 2016-12-03 DIAGNOSIS — W57XXXA Bitten or stung by nonvenomous insect and other nonvenomous arthropods, initial encounter: Secondary | ICD-10-CM | POA: Insufficient documentation

## 2016-12-03 DIAGNOSIS — N390 Urinary tract infection, site not specified: Secondary | ICD-10-CM | POA: Insufficient documentation

## 2016-12-03 DIAGNOSIS — Z86010 Personal history of colonic polyps: Secondary | ICD-10-CM

## 2016-12-03 DIAGNOSIS — Z800 Family history of malignant neoplasm of digestive organs: Secondary | ICD-10-CM

## 2016-12-03 MED ORDER — NA SULFATE-K SULFATE-MG SULF 17.5-3.13-1.6 GM/177ML PO SOLN: ORAL | 0 refills | Status: DC

## 2016-12-03 NOTE — Progress Notes (Signed)
Patient denies any allergies to eggs or soy. Patient denies any problems with anesthesia/sedation. Patient denies any oxygen use at home. Patient denies taking any diet/weight loss medications or blood thinners. EMMI education assisgned to patient on colonoscopy, this was explained and instructions given to patient. 

## 2016-12-05 ENCOUNTER — Encounter: Payer: Self-pay | Admitting: Gastroenterology

## 2016-12-13 ENCOUNTER — Encounter (INDEPENDENT_AMBULATORY_CARE_PROVIDER_SITE_OTHER): Payer: Self-pay

## 2016-12-13 ENCOUNTER — Ambulatory Visit
Admission: RE | Admit: 2016-12-13 | Discharge: 2016-12-13 | Disposition: A | Discharge status: Home / Self Care | Payer: Medicare Other | Source: Ambulatory Visit | Attending: Internal Medicine | Admitting: Internal Medicine

## 2016-12-13 DIAGNOSIS — Z1231 Encounter for screening mammogram for malignant neoplasm of breast: Secondary | ICD-10-CM

## 2016-12-17 ENCOUNTER — Ambulatory Visit (AMBULATORY_SURGERY_CENTER): Payer: Medicare Other | Admitting: Gastroenterology

## 2016-12-17 ENCOUNTER — Encounter: Payer: Self-pay | Admitting: Gastroenterology

## 2016-12-17 VITALS — BP 114/70 | HR 76 | Temp 98.4°F | Resp 17 | Ht 61.0 in | Wt 105.0 lb

## 2016-12-17 DIAGNOSIS — Z86010 Personal history of colonic polyps: Secondary | ICD-10-CM | POA: Diagnosis present

## 2016-12-17 DIAGNOSIS — D125 Benign neoplasm of sigmoid colon: Secondary | ICD-10-CM | POA: Diagnosis not present

## 2016-12-17 DIAGNOSIS — K621 Rectal polyp: Secondary | ICD-10-CM

## 2016-12-17 DIAGNOSIS — D128 Benign neoplasm of rectum: Secondary | ICD-10-CM

## 2016-12-17 MED ORDER — SODIUM CHLORIDE 0.9 % IV SOLN: 500.0000 mL | INTRAVENOUS | Status: DC

## 2016-12-17 NOTE — Op Note (Signed)
[facility] Patient Name: [name] Procedure Date: 12/17/2016 8:44 AM MRN: [id number] Endoscopist: Mauri Pole , MD Age: 66 Referring MD:  Date of Birth: 06/16/1950 Gender: Female Account #: [id number] Procedure:                Colonoscopy Indications:              Surveillance: Personal history of adenomatous                            polyps on last colonoscopy 5 years ago Medicines:                Monitored Anesthesia Care Procedure:                Pre-Anesthesia Assessment:                           - Prior to the procedure, a History and Physical                            was performed, and patient medications and                            allergies were reviewed. The patient's tolerance of                            previous anesthesia was also reviewed. The risks                            and benefits of the procedure and the sedation                            options and risks were discussed with the patient.                            All questions were answered, and informed consent                            was obtained. Prior Anticoagulants: The patient has                            taken no previous anticoagulant or antiplatelet                            agents. ASA Grade Assessment: II - A patient with                            mild systemic disease. After reviewing the risks                            and benefits, the patient was deemed in                            satisfactory condition to undergo the procedure.  After obtaining informed consent, the colonoscope                            was passed under direct vision. Throughout the                            procedure, the patient's blood pressure, pulse, and                            oxygen saturations were monitored continuously. The                            Colonoscope was introduced through the anus and                            advanced to the the  cecum, identified by                            appendiceal orifice and ileocecal valve. The                            colonoscopy was performed without difficulty. The                            patient tolerated the procedure well. The quality                            of the bowel preparation was excellent. The                            ileocecal valve, appendiceal orifice, and rectum                            were photographed. Scope In: 8:53:57 AM Scope Out: 9:18:26 AM Scope Withdrawal Time: 0 hours 20 minutes 6 seconds  Total Procedure Duration: 0 hours 24 minutes 29 seconds  Findings:                 The perianal and digital rectal examinations were                            normal.                           A 5 mm polyp was found in the sigmoid colon. The                            polyp was sessile. The polyp was removed with a                            cold snare. Resection and retrieval were complete.                           A 3 mm polyp was found in the rectum. The polyp was  sessile. The polyp was removed with a cold biopsy                            forceps. Resection and retrieval were complete.                           Non-bleeding internal hemorrhoids were found during                            retroflexion. The hemorrhoids were small. Complications:            No immediate complications. Estimated Blood Loss:     Estimated blood loss was minimal. Impression:               - One 5 mm polyp in the sigmoid colon, removed with                            a cold snare. Resected and retrieved.                           - One 3 mm polyp in the rectum, removed with a cold                            biopsy forceps. Resected and retrieved.                           - Non-bleeding internal hemorrhoids. Recommendation:           - Patient has a contact number available for                            emergencies. The signs and symptoms of potential                             delayed complications were discussed with the                            patient. Return to normal activities tomorrow.                            Written discharge instructions were provided to the                            patient.                           - Resume previous diet.                           - Continue present medications.                           - Await pathology results.                           - Repeat colonoscopy in 5-10 years for surveillance  based on pathology results. Mauri Pole, MD 12/17/2016 9:24:49 AM This report has been signed electronically.

## 2016-12-17 NOTE — Progress Notes (Signed)
Called to room to assist during endoscopic procedure.  Patient ID and intended procedure confirmed with present staff. Received instructions for my participation in the procedure from the performing physician.  

## 2016-12-17 NOTE — Progress Notes (Signed)
A and O x3. Report to RN. Tolerated MAC anesthesia well.

## 2016-12-17 NOTE — Patient Instructions (Signed)
YOU HAD AN ENDOSCOPIC PROCEDURE TODAY AT THE [facility]:   Refer to the procedure report that was given to you for any specific questions about what was found during the examination.  If the procedure report does not answer your questions, please call your gastroenterologist to clarify.  If you requested that your care partner not be given the details of your procedure findings, then the procedure report has been included in a sealed envelope for you to review at your convenience later.  YOU SHOULD EXPECT: Some feelings of bloating in the abdomen. Passage of more gas than usual.  Walking can help get rid of the air that was put into your GI tract during the procedure and reduce the bloating. If you had a lower endoscopy (such as a colonoscopy or flexible sigmoidoscopy) you may notice spotting of blood in your stool or on the toilet paper. If you underwent a bowel prep for your procedure, you may not have a normal bowel movement for a few days.  Please Note:  You might notice some irritation and congestion in your nose or some drainage.  This is from the oxygen used during your procedure.  There is no need for concern and it should clear up in a day or so.  SYMPTOMS TO REPORT IMMEDIATELY:   Following lower endoscopy (colonoscopy or flexible sigmoidoscopy):  Excessive amounts of blood in the stool  Significant tenderness or worsening of abdominal pains  Swelling of the abdomen that is new, acute  Fever of 100F or higher   Please read all handouts given to you by your recovery nurse.  For urgent or emergent issues, a gastroenterologist can be reached at any hour by calling [phone].   DIET:  We do recommend a small meal at first, but then you may proceed to your regular diet.  Drink plenty of fluids but you should avoid alcoholic beverages for 24 hours.  ACTIVITY:  You should plan to take it easy for the rest of today and you should NOT DRIVE or use heavy machinery until  tomorrow (because of the sedation medicines used during the test).    FOLLOW UP: Our staff will call the number listed on your records the next business day following your procedure to check on you and address any questions or concerns that you may have regarding the information given to you following your procedure. If we do not reach you, we will leave a message.  However, if you are feeling well and you are not experiencing any problems, there is no need to return our call.  We will assume that you have returned to your regular daily activities without incident.  If any biopsies were taken you will be contacted by phone or by letter within the next 1-3 weeks.  Please call us at [phone] if you have not heard about the biopsies in 3 weeks.    SIGNATURES/CONFIDENTIALITY: You and/or your care partner have signed paperwork which will be entered into your electronic medical record.  These signatures attest to the fact that that the information above on your After Visit Summary has been reviewed and is understood.  Full responsibility of the confidentiality of this discharge information lies with you and/or your care-partner.  Thank you for letting us take care of your healthcare needs today. 

## 2016-12-18 ENCOUNTER — Telehealth: Payer: Self-pay | Admitting: *Deleted

## 2016-12-18 NOTE — Telephone Encounter (Signed)
  Follow up Call-  Call back number 12/17/2016 12/17/2014  Post procedure Call Back phone  # [phone], [phone] (Cell) [phone]  Permission to leave phone message Yes Yes  Some recent data might be hidden     Patient questions:  Do you have a fever, pain , or abdominal swelling? No. Pain Score  0 *  Have you tolerated food without any problems? Yes.    Have you been able to return to your normal activities? Yes.    Do you have any questions about your discharge instructions: Diet   No. Medications  No. Follow up visit  No.  Do you have questions or concerns about your Care? No.  Actions: * If pain score is 4 or above: No action needed, pain <4.

## 2016-12-21 ENCOUNTER — Encounter: Payer: Self-pay | Admitting: Gastroenterology

## 2017-11-08 ENCOUNTER — Other Ambulatory Visit: Payer: Self-pay | Admitting: Internal Medicine

## 2017-11-08 DIAGNOSIS — Z1231 Encounter for screening mammogram for malignant neoplasm of breast: Secondary | ICD-10-CM

## 2017-12-19 ENCOUNTER — Ambulatory Visit
Admission: RE | Admit: 2017-12-19 | Discharge: 2017-12-19 | Disposition: A | Discharge status: Home / Self Care | Payer: Medicare Other | Source: Ambulatory Visit | Attending: Internal Medicine | Admitting: Internal Medicine

## 2017-12-19 DIAGNOSIS — Z1231 Encounter for screening mammogram for malignant neoplasm of breast: Secondary | ICD-10-CM

## 2018-08-10 ENCOUNTER — Emergency Department (HOSPITAL_COMMUNITY)
Admission: EM | Admit: 2018-08-10 | Discharge: 2018-08-10 | Disposition: A | Discharge status: Home / Self Care | Payer: Medicare Other | Attending: Emergency Medicine | Admitting: Emergency Medicine

## 2018-08-10 ENCOUNTER — Other Ambulatory Visit: Payer: Self-pay

## 2018-08-10 ENCOUNTER — Encounter (HOSPITAL_COMMUNITY): Payer: Self-pay | Admitting: Emergency Medicine

## 2018-08-10 ENCOUNTER — Emergency Department (HOSPITAL_COMMUNITY): Payer: Medicare Other

## 2018-08-10 DIAGNOSIS — R0789 Other chest pain: Secondary | ICD-10-CM | POA: Insufficient documentation

## 2018-08-10 DIAGNOSIS — Z20828 Contact with and (suspected) exposure to other viral communicable diseases: Secondary | ICD-10-CM | POA: Diagnosis not present

## 2018-08-10 DIAGNOSIS — R0602 Shortness of breath: Secondary | ICD-10-CM | POA: Insufficient documentation

## 2018-08-10 LAB — BASIC METABOLIC PANEL
Anion gap: 8 (ref 5–15)
BUN: 16 mg/dL (ref 8–23)
CO2: 24 mmol/L (ref 22–32)
Calcium: 9.5 mg/dL (ref 8.9–10.3)
Chloride: 105 mmol/L (ref 98–111)
Creatinine, Ser: 0.89 mg/dL (ref 0.44–1.00)
GFR calc Af Amer: >60 mL/min (ref >60)
GFR calc non Af Amer: >60 mL/min (ref >60)
Glucose, Bld: 115 mg/dL — ABNORMAL HIGH (ref 70–99)
Potassium: 4.1 mmol/L (ref 3.5–5.1)
Sodium: 137 mmol/L (ref 135–145)

## 2018-08-10 LAB — BRAIN NATRIURETIC PEPTIDE: B Natriuretic Peptide: 19 pg/mL (ref 0.0–100.0)

## 2018-08-10 LAB — TROPONIN I (HIGH SENSITIVITY): Troponin I (High Sensitivity): <2 ng/L (ref <18)

## 2018-08-10 MED ORDER — AMOXICILLIN 500 MG PO CAPS: 500.0000 mg | ORAL_CAPSULE | Freq: Three times a day (TID) | ORAL | 0 refills | Status: DC

## 2018-08-10 NOTE — ED Triage Notes (Signed)
Pt states that she woke up with sob congestion and drainage in her throat.

## 2018-08-10 NOTE — Discharge Instructions (Addendum)
You were seen in the emergency department for shortness of breath.  You had a Covid test that is pending and we will call you if you are positive.  You had blood work and EKG and a chest x-ray did not show an obvious pneumonia or signs of heart injury.  We are starting you on some antibiotics for possible sinus infection.  Please drink plenty of fluids and call your doctor regarding whether you should be on prednisone or not.  Return to the emergency department if any high fevers or concerning symptoms.

## 2018-08-10 NOTE — ED Provider Notes (Signed)
[facility] EMERGENCY DEPARTMENT Provider Note   CSN: [id number] Arrival date & time: 08/10/18  1513     History   Chief Complaint Chief Complaint  Patient presents with  . Shortness of Breath    HPI [name] is a 68 y.o. female.  She has a history of lupus and is intermittently on prednisone.  She is complaining of feeling short of breath since last night with a little bit of chest pressure.  She said she feels some drainage in her throat and the right side of her head feels congested.  No fever no cough no vomiting or diarrhea.  Appetite is been good and she still has a sense of smell and taste.  No sick contacts or recent travel.     The history is provided by the patient.  Shortness of Breath Severity:  Moderate Onset quality:  Gradual Duration:  2 days Timing:  Constant Progression:  Unchanged Chronicity:  New Context: activity   Relieved by:  None tried Worsened by:  Nothing Ineffective treatments:  None tried Associated symptoms: chest pain   Associated symptoms: no abdominal pain, no cough, no ear pain, no fever, no headaches, no neck pain, no rash, no sore throat, no sputum production, no syncope, no vomiting and no wheezing     Past Medical History:  Diagnosis Date  . Allergy   . Anemia   . Diverticulosis 2007   Dr Oneida Alar  . Febrile illness   . Lupus ([place])   . Pyelonephritis    pt denies  . Rheumatoid arthritis(714.0)   . Sickle cell trait [facility])     Patient Active Problem List   Diagnosis Date Noted  . Lower urinary tract infectious disease 12/03/2016  . Tick bite 12/03/2016  . Dysphagia   . Colon cancer screening 12/05/2011  . FUO (fever of unknown origin) 06/21/2010    Past Surgical History:  Procedure Laterality Date  . ABDOMINAL HYSTERECTOMY    . BUNIONECTOMY    . COLONOSCOPY  10/02/2005   SLF: Rare diverticulosis.  Otherwise, no polyps, masses, inflammatory changes or vascular ectasia seen/ Normal retroflexed view of the rectum   . COLONOSCOPY  12/07/2011   Procedure: COLONOSCOPY;  Surgeon: Danie Binder, MD;  [facility];  Service: Endoscopy;  Laterality: N/A;  3:30  . ESOPHAGEAL MANOMETRY N/A 02/11/2015   Procedure: ESOPHAGEAL MANOMETRY (EM);  Surgeon: Mauri Pole, MD;  Location: [facility];  Service: Endoscopy;  Laterality: N/A;  . UPPER GASTROINTESTINAL ENDOSCOPY       OB History   No obstetric history on file.      Home Medications    Prior to Admission medications   Medication Sig Start Date End Date Taking? Authorizing Provider  Cholecalciferol (VITAMIN D3 PO) Take 1 tablet daily by mouth.    [provider]  fluticasone (FLONASE) 50 MCG/ACT nasal spray Place 1 spray into both nostrils daily.    [provider]  FOLIC ACID PO Take 1 tablet daily by mouth.    [provider]  hydroxychloroquine (PLAQUENIL) 200 MG tablet Take 200 mg by mouth daily. 11/08/14   [provider]  Multiple Vitamin (MULTIVITAMIN) tablet Take 1 tablet by mouth daily.      [provider]  Polyethyl Glycol-Propyl Glycol (SYSTANE OP) Apply 1 drop to eye daily as needed (dry eyes).    [provider]  predniSONE (DELTASONE) 10 MG tablet Take 10 mg by mouth 2 (two) times daily. 11/08/14  [provider]  zolpidem (AMBIEN) 10 MG tablet Take 1 tablet at bedtime as needed by mouth.    [provider]    Family History Family History  Problem Relation Age of Onset  . Diabetes Father   . Colon cancer Mother 72  . Hypertension Other        two brothers, sister   . Esophageal cancer Neg Hx   . Rectal cancer Neg Hx   . Stomach cancer Neg Hx     Social History Social History   Tobacco Use  . Smoking status: Never Smoker  . Smokeless tobacco: Never Used  Substance Use Topics  . Alcohol use: Yes    Alcohol/week: 0.0 standard drinks    Comment: teaspoon at night whiskey to help me sleep per pt  . Drug use: No     Allergies    Ciprofloxacin, Nsaids, Sudafed [pseudoephedrine], Sulfa drugs cross reactors, Tolmetin, and Aspirin   Review of Systems Review of Systems  Constitutional: Negative for fever.  HENT: Positive for congestion, postnasal drip and sinus pressure. Negative for ear pain and sore throat.   Eyes: Negative for visual disturbance.  Respiratory: Positive for shortness of breath. Negative for cough, sputum production and wheezing.   Cardiovascular: Positive for chest pain. Negative for syncope.  Gastrointestinal: Negative for abdominal pain and vomiting.  Genitourinary: Negative for dysuria.  Musculoskeletal: Negative for neck pain.  Skin: Negative for rash.  Neurological: Negative for headaches.     Physical Exam Updated Vital Signs BP (!) 152/90 (BP Location: Right Arm)   Pulse 94   Temp 98.7 F (37.1 C) (Oral)   Resp 14   Ht 5' (1.524 m)   Wt 43.5 kg   SpO2 100%   BMI 18.75 kg/m   Physical Exam Vitals signs and nursing note reviewed.  Constitutional:      General: She is not in acute distress.    Appearance: She is well-developed.  HENT:     Head: Normocephalic and atraumatic.     Right Ear: Tympanic membrane normal.     Left Ear: Tympanic membrane normal.     Mouth/Throat:     Mouth: Mucous membranes are moist.     Pharynx: Oropharynx is clear.  Eyes:     Extraocular Movements: Extraocular movements intact.     Conjunctiva/sclera: Conjunctivae normal.     Pupils: Pupils are equal, round, and reactive to light.  Neck:     Musculoskeletal: Normal range of motion and neck supple.  Cardiovascular:     Rate and Rhythm: Normal rate and regular rhythm.     Heart sounds: No murmur.  Pulmonary:     Effort: Pulmonary effort is normal. No respiratory distress.     Breath sounds: Normal breath sounds.  Abdominal:     Palpations: Abdomen is soft.     Tenderness: There is no abdominal tenderness.  Musculoskeletal: Normal range of motion.     Right lower leg: She exhibits no  tenderness. No edema.     Left lower leg: She exhibits no tenderness. No edema.  Skin:    General: Skin is warm and dry.     Capillary Refill: Capillary refill takes less than 2 seconds.  Neurological:     General: No focal deficit present.     Mental Status: She is alert and oriented to person, place, and time.      ED Treatments / Results  Labs (all labs ordered are listed, but only abnormal results are displayed)  Labs Reviewed  BASIC METABOLIC PANEL - Abnormal; Notable for the following components:      Result Value   Glucose, Bld 115 (*)    All other components within normal limits  NOVEL CORONAVIRUS, NAA (HOSPITAL ORDER, SEND-OUT TO REF LAB)  BRAIN NATRIURETIC PEPTIDE  TROPONIN I (HIGH SENSITIVITY)    EKG EKG Interpretation  Date/Time:  "Sunday August 10 2018 16:41:49 EDT Ventricular Rate:  84 PR Interval:    QRS Duration: 94 QT Interval:  378 QTC Calculation: 447 R Axis:   58 Text Interpretation:  Sinus rhythm similar to prior 9/16 Confirmed by Amayra Kiedrowski ([id number]) on 08/10/2018 5:27:04 PM   Radiology Dg Chest Port 1 View  Result Date: 08/10/2018 CLINICAL DATA:  Shortness of breath, congestion EXAM: PORTABLE CHEST 1 VIEW COMPARISON:  None. FINDINGS: The heart size and mediastinal contours are within normal limits. No acute appearing airspace opacity. Minimal bandlike atelectasis or scarring of the right lung base. The visualized skeletal structures are unremarkable. IMPRESSION: No acute abnormality of the lungs. Minimal bandlike scarring or atelectasis of the right lung base. Electronically Signed   By: Alex  Bibbey M.D.   On: 08/10/2018 16:42    Procedures Procedures (including critical care time)  Medications Ordered in ED Medications - No data to display   Initial Impression / Assessment and Plan / ED Course  I have reviewed the triage vital signs and the nursing notes.  Pertinent labs & imaging results that were available during my care of the patient  were reviewed by me and considered in my medical decision making (see chart for details).  Clinical Course as of Aug 11 951  Sun Aug 10, 2018  1802 68 year old female here with nasal congestion and shortness of breath.  She is concerned she has a sinus infection.  Other things in the differential to consider would be ACS, pneumonia, Covid, CHF.  Her EKG and BMP are normal.  Troponin detectable.  Chest x-ray does not show any infiltrates.  I reviewed all this with the patient and she is asking to be put on some antibiotics for possible sinus infection.  She is also planning on taking some steroids for her lupus which is what she does when she starts with a respiratory flare.  I asked that she call her doctor regarding this.   [MB]    Clinical Course User Index [MB] Ellarose Brandi C, MD   [name] was evaluated in Emergency Department on 08/10/2018 for the symptoms described in the history of present illness. She was evaluated in the context of the global COVID-19 pandemic, which necessitated consideration that the patient might be at risk for infection with the SARS-CoV-2 virus that causes COVID-19. Institutional protocols and algorithms that pertain to the evaluation of patients at risk for COVID-19 are in a state of rapid change based on information released by regulatory bodies including the CDC and federal and state organizations. These policies and algorithms were followed during the patient's care in the ED.     Final Clinical Impressions(s) / ED Diagnoses   Final diagnoses:  SOB (shortness of breath)    ED Discharge Orders         Ordered    amoxicillin (AMOXIL) 500 MG capsule  3 times daily     07" /26/20 1804           Hayden Rasmussen, MD 08/11/18 [phone]

## 2018-08-12 LAB — NOVEL CORONAVIRUS, NAA (HOSP ORDER, SEND-OUT TO REF LAB; TAT 18-24 HRS): SARS-CoV-2, NAA: NOT DETECTED

## 2018-10-20 ENCOUNTER — Other Ambulatory Visit: Payer: Self-pay | Admitting: Internal Medicine

## 2018-10-20 DIAGNOSIS — Z1231 Encounter for screening mammogram for malignant neoplasm of breast: Secondary | ICD-10-CM

## 2018-12-22 ENCOUNTER — Other Ambulatory Visit: Payer: Self-pay

## 2018-12-22 ENCOUNTER — Ambulatory Visit
Admission: RE | Admit: 2018-12-22 | Discharge: 2018-12-22 | Disposition: A | Discharge status: Home / Self Care | Payer: Medicare Other | Source: Ambulatory Visit | Attending: Internal Medicine | Admitting: Internal Medicine

## 2018-12-22 DIAGNOSIS — Z1231 Encounter for screening mammogram for malignant neoplasm of breast: Secondary | ICD-10-CM

## 2019-09-02 DIAGNOSIS — N390 Urinary tract infection, site not specified: Secondary | ICD-10-CM | POA: Diagnosis not present

## 2019-09-02 DIAGNOSIS — R300 Dysuria: Secondary | ICD-10-CM | POA: Diagnosis not present

## 2019-09-30 DIAGNOSIS — Z79899 Other long term (current) drug therapy: Secondary | ICD-10-CM | POA: Diagnosis not present

## 2019-09-30 DIAGNOSIS — M65311 Trigger thumb, right thumb: Secondary | ICD-10-CM | POA: Diagnosis not present

## 2019-09-30 DIAGNOSIS — Z681 Body mass index (BMI) 19 or less, adult: Secondary | ICD-10-CM | POA: Diagnosis not present

## 2019-09-30 DIAGNOSIS — M329 Systemic lupus erythematosus, unspecified: Secondary | ICD-10-CM | POA: Diagnosis not present

## 2019-09-30 DIAGNOSIS — R21 Rash and other nonspecific skin eruption: Secondary | ICD-10-CM | POA: Diagnosis not present

## 2019-09-30 DIAGNOSIS — L659 Nonscarring hair loss, unspecified: Secondary | ICD-10-CM | POA: Diagnosis not present

## 2019-09-30 DIAGNOSIS — D649 Anemia, unspecified: Secondary | ICD-10-CM | POA: Diagnosis not present

## 2019-10-27 DIAGNOSIS — R030 Elevated blood-pressure reading, without diagnosis of hypertension: Secondary | ICD-10-CM | POA: Diagnosis not present

## 2019-10-27 DIAGNOSIS — E559 Vitamin D deficiency, unspecified: Secondary | ICD-10-CM | POA: Diagnosis not present

## 2019-10-27 DIAGNOSIS — D8989 Other specified disorders involving the immune mechanism, not elsewhere classified: Secondary | ICD-10-CM | POA: Diagnosis not present

## 2019-11-03 DIAGNOSIS — D8989 Other specified disorders involving the immune mechanism, not elsewhere classified: Secondary | ICD-10-CM | POA: Diagnosis not present

## 2019-11-03 DIAGNOSIS — Z0000 Encounter for general adult medical examination without abnormal findings: Secondary | ICD-10-CM | POA: Diagnosis not present

## 2019-11-03 DIAGNOSIS — D638 Anemia in other chronic diseases classified elsewhere: Secondary | ICD-10-CM | POA: Diagnosis not present

## 2019-11-03 DIAGNOSIS — H903 Sensorineural hearing loss, bilateral: Secondary | ICD-10-CM | POA: Diagnosis not present

## 2019-11-03 DIAGNOSIS — Z23 Encounter for immunization: Secondary | ICD-10-CM | POA: Diagnosis not present

## 2019-11-03 DIAGNOSIS — R82998 Other abnormal findings in urine: Secondary | ICD-10-CM | POA: Diagnosis not present

## 2019-11-03 DIAGNOSIS — H259 Unspecified age-related cataract: Secondary | ICD-10-CM | POA: Diagnosis not present

## 2019-11-03 DIAGNOSIS — M3210 Systemic lupus erythematosus, organ or system involvement unspecified: Secondary | ICD-10-CM | POA: Diagnosis not present

## 2019-11-03 DIAGNOSIS — J302 Other seasonal allergic rhinitis: Secondary | ICD-10-CM | POA: Diagnosis not present

## 2019-11-03 DIAGNOSIS — D72819 Decreased white blood cell count, unspecified: Secondary | ICD-10-CM | POA: Diagnosis not present

## 2019-11-09 ENCOUNTER — Telehealth: Payer: Self-pay | Admitting: Gastroenterology

## 2019-11-09 NOTE — Telephone Encounter (Signed)
Hi Dr. Silverio Decamp, Pt's sister was dx with colon cancer last year pt wants to know if you suggest that she has her colonoscopy sooner than 2023 the new findings on her fam hx colon cancer. She is not having any GI issues. Please advise.

## 2019-11-09 NOTE — Telephone Encounter (Signed)
Left message to call back  

## 2019-11-09 NOTE — Telephone Encounter (Signed)
Even with positive family history the current recommendation is to repeat colonoscopy in 5 years unless there is any specific genetic defect that can potentially increase the risk significantly. Please check if patient or her sister underwent any genetic testing, please schedule office visit if have further questions

## 2019-11-11 NOTE — Telephone Encounter (Signed)
Spoke with pt, she stated that neither her or her sister had genetic testing. She is not having any GI issues so she will wait for her recall date (12/2021) ot she will call for an appt if she develops GI sxs.

## 2019-11-12 ENCOUNTER — Other Ambulatory Visit: Payer: Self-pay | Admitting: Internal Medicine

## 2019-11-12 DIAGNOSIS — Z1231 Encounter for screening mammogram for malignant neoplasm of breast: Secondary | ICD-10-CM

## 2019-12-23 ENCOUNTER — Other Ambulatory Visit: Payer: Self-pay

## 2019-12-23 ENCOUNTER — Ambulatory Visit
Admission: RE | Admit: 2019-12-23 | Discharge: 2019-12-23 | Disposition: A | Discharge status: Home / Self Care | Payer: Medicare PPO | Source: Ambulatory Visit | Attending: Internal Medicine | Admitting: Internal Medicine

## 2019-12-23 DIAGNOSIS — Z1231 Encounter for screening mammogram for malignant neoplasm of breast: Secondary | ICD-10-CM

## 2019-12-31 DIAGNOSIS — Z1212 Encounter for screening for malignant neoplasm of rectum: Secondary | ICD-10-CM | POA: Diagnosis not present

## 2020-02-03 DIAGNOSIS — M329 Systemic lupus erythematosus, unspecified: Secondary | ICD-10-CM | POA: Diagnosis not present

## 2020-02-03 DIAGNOSIS — H5213 Myopia, bilateral: Secondary | ICD-10-CM | POA: Diagnosis not present

## 2020-02-03 DIAGNOSIS — Z961 Presence of intraocular lens: Secondary | ICD-10-CM | POA: Diagnosis not present

## 2020-02-03 DIAGNOSIS — H524 Presbyopia: Secondary | ICD-10-CM | POA: Diagnosis not present

## 2020-02-03 DIAGNOSIS — Z79899 Other long term (current) drug therapy: Secondary | ICD-10-CM | POA: Diagnosis not present

## 2020-03-30 DIAGNOSIS — Z6820 Body mass index (BMI) 20.0-20.9, adult: Secondary | ICD-10-CM | POA: Diagnosis not present

## 2020-03-30 DIAGNOSIS — Z79899 Other long term (current) drug therapy: Secondary | ICD-10-CM | POA: Diagnosis not present

## 2020-03-30 DIAGNOSIS — D649 Anemia, unspecified: Secondary | ICD-10-CM | POA: Diagnosis not present

## 2020-03-30 DIAGNOSIS — R21 Rash and other nonspecific skin eruption: Secondary | ICD-10-CM | POA: Diagnosis not present

## 2020-03-30 DIAGNOSIS — M65311 Trigger thumb, right thumb: Secondary | ICD-10-CM | POA: Diagnosis not present

## 2020-03-30 DIAGNOSIS — M542 Cervicalgia: Secondary | ICD-10-CM | POA: Diagnosis not present

## 2020-03-30 DIAGNOSIS — M329 Systemic lupus erythematosus, unspecified: Secondary | ICD-10-CM | POA: Diagnosis not present

## 2020-03-30 DIAGNOSIS — L659 Nonscarring hair loss, unspecified: Secondary | ICD-10-CM | POA: Diagnosis not present

## 2020-05-05 DIAGNOSIS — R350 Frequency of micturition: Secondary | ICD-10-CM | POA: Diagnosis not present

## 2020-05-11 DIAGNOSIS — E559 Vitamin D deficiency, unspecified: Secondary | ICD-10-CM | POA: Diagnosis not present

## 2020-05-11 DIAGNOSIS — R030 Elevated blood-pressure reading, without diagnosis of hypertension: Secondary | ICD-10-CM | POA: Diagnosis not present

## 2020-05-11 DIAGNOSIS — D8989 Other specified disorders involving the immune mechanism, not elsewhere classified: Secondary | ICD-10-CM | POA: Diagnosis not present

## 2020-05-11 DIAGNOSIS — K219 Gastro-esophageal reflux disease without esophagitis: Secondary | ICD-10-CM | POA: Diagnosis not present

## 2020-05-11 DIAGNOSIS — F419 Anxiety disorder, unspecified: Secondary | ICD-10-CM | POA: Diagnosis not present

## 2020-05-11 DIAGNOSIS — Z1339 Encounter for screening examination for other mental health and behavioral disorders: Secondary | ICD-10-CM | POA: Diagnosis not present

## 2020-05-11 DIAGNOSIS — D638 Anemia in other chronic diseases classified elsewhere: Secondary | ICD-10-CM | POA: Diagnosis not present

## 2020-05-11 DIAGNOSIS — M3210 Systemic lupus erythematosus, organ or system involvement unspecified: Secondary | ICD-10-CM | POA: Diagnosis not present

## 2020-05-11 DIAGNOSIS — Z1331 Encounter for screening for depression: Secondary | ICD-10-CM | POA: Diagnosis not present

## 2020-08-12 DIAGNOSIS — N390 Urinary tract infection, site not specified: Secondary | ICD-10-CM | POA: Diagnosis not present

## 2020-10-05 DIAGNOSIS — M65311 Trigger thumb, right thumb: Secondary | ICD-10-CM | POA: Diagnosis not present

## 2020-10-05 DIAGNOSIS — Z681 Body mass index (BMI) 19 or less, adult: Secondary | ICD-10-CM | POA: Diagnosis not present

## 2020-10-05 DIAGNOSIS — R21 Rash and other nonspecific skin eruption: Secondary | ICD-10-CM | POA: Diagnosis not present

## 2020-10-05 DIAGNOSIS — M542 Cervicalgia: Secondary | ICD-10-CM | POA: Diagnosis not present

## 2020-10-05 DIAGNOSIS — D649 Anemia, unspecified: Secondary | ICD-10-CM | POA: Diagnosis not present

## 2020-10-05 DIAGNOSIS — L659 Nonscarring hair loss, unspecified: Secondary | ICD-10-CM | POA: Diagnosis not present

## 2020-10-05 DIAGNOSIS — Z79899 Other long term (current) drug therapy: Secondary | ICD-10-CM | POA: Diagnosis not present

## 2020-10-05 DIAGNOSIS — M329 Systemic lupus erythematosus, unspecified: Secondary | ICD-10-CM | POA: Diagnosis not present

## 2020-11-10 DIAGNOSIS — E559 Vitamin D deficiency, unspecified: Secondary | ICD-10-CM | POA: Diagnosis not present

## 2020-11-10 DIAGNOSIS — M810 Age-related osteoporosis without current pathological fracture: Secondary | ICD-10-CM | POA: Diagnosis not present

## 2020-11-10 DIAGNOSIS — D638 Anemia in other chronic diseases classified elsewhere: Secondary | ICD-10-CM | POA: Diagnosis not present

## 2020-11-10 DIAGNOSIS — Z79899 Other long term (current) drug therapy: Secondary | ICD-10-CM | POA: Diagnosis not present

## 2020-11-14 ENCOUNTER — Other Ambulatory Visit: Payer: Self-pay | Admitting: Internal Medicine

## 2020-11-14 DIAGNOSIS — Z1231 Encounter for screening mammogram for malignant neoplasm of breast: Secondary | ICD-10-CM

## 2020-11-14 DIAGNOSIS — R82998 Other abnormal findings in urine: Secondary | ICD-10-CM | POA: Diagnosis not present

## 2020-11-16 DIAGNOSIS — Z0000 Encounter for general adult medical examination without abnormal findings: Secondary | ICD-10-CM | POA: Diagnosis not present

## 2020-11-16 DIAGNOSIS — F419 Anxiety disorder, unspecified: Secondary | ICD-10-CM | POA: Diagnosis not present

## 2020-11-16 DIAGNOSIS — D8989 Other specified disorders involving the immune mechanism, not elsewhere classified: Secondary | ICD-10-CM | POA: Diagnosis not present

## 2020-11-16 DIAGNOSIS — Z23 Encounter for immunization: Secondary | ICD-10-CM | POA: Diagnosis not present

## 2020-11-16 DIAGNOSIS — M810 Age-related osteoporosis without current pathological fracture: Secondary | ICD-10-CM | POA: Diagnosis not present

## 2020-11-16 DIAGNOSIS — R030 Elevated blood-pressure reading, without diagnosis of hypertension: Secondary | ICD-10-CM | POA: Diagnosis not present

## 2020-11-16 DIAGNOSIS — D72819 Decreased white blood cell count, unspecified: Secondary | ICD-10-CM | POA: Diagnosis not present

## 2020-11-16 DIAGNOSIS — E559 Vitamin D deficiency, unspecified: Secondary | ICD-10-CM | POA: Diagnosis not present

## 2020-11-16 DIAGNOSIS — M3210 Systemic lupus erythematosus, organ or system involvement unspecified: Secondary | ICD-10-CM | POA: Diagnosis not present

## 2020-11-16 DIAGNOSIS — Z1331 Encounter for screening for depression: Secondary | ICD-10-CM | POA: Diagnosis not present

## 2020-11-16 DIAGNOSIS — D692 Other nonthrombocytopenic purpura: Secondary | ICD-10-CM | POA: Diagnosis not present

## 2020-12-23 ENCOUNTER — Ambulatory Visit
Admission: RE | Admit: 2020-12-23 | Discharge: 2020-12-23 | Disposition: A | Discharge status: Home / Self Care | Payer: Medicare PPO | Source: Ambulatory Visit | Attending: Internal Medicine | Admitting: Internal Medicine

## 2020-12-23 DIAGNOSIS — Z1231 Encounter for screening mammogram for malignant neoplasm of breast: Secondary | ICD-10-CM

## 2020-12-26 DIAGNOSIS — M810 Age-related osteoporosis without current pathological fracture: Secondary | ICD-10-CM | POA: Diagnosis not present

## 2021-02-01 DIAGNOSIS — M3210 Systemic lupus erythematosus, organ or system involvement unspecified: Secondary | ICD-10-CM | POA: Diagnosis not present

## 2021-02-01 DIAGNOSIS — H524 Presbyopia: Secondary | ICD-10-CM | POA: Diagnosis not present

## 2021-02-01 DIAGNOSIS — Z961 Presence of intraocular lens: Secondary | ICD-10-CM | POA: Diagnosis not present

## 2021-02-01 DIAGNOSIS — Z79899 Other long term (current) drug therapy: Secondary | ICD-10-CM | POA: Diagnosis not present

## 2021-02-08 DIAGNOSIS — N390 Urinary tract infection, site not specified: Secondary | ICD-10-CM | POA: Diagnosis not present

## 2021-03-29 DIAGNOSIS — M329 Systemic lupus erythematosus, unspecified: Secondary | ICD-10-CM | POA: Diagnosis not present

## 2021-03-29 DIAGNOSIS — M65311 Trigger thumb, right thumb: Secondary | ICD-10-CM | POA: Diagnosis not present

## 2021-03-29 DIAGNOSIS — Z79899 Other long term (current) drug therapy: Secondary | ICD-10-CM | POA: Diagnosis not present

## 2021-03-29 DIAGNOSIS — M810 Age-related osteoporosis without current pathological fracture: Secondary | ICD-10-CM | POA: Diagnosis not present

## 2021-03-29 DIAGNOSIS — M542 Cervicalgia: Secondary | ICD-10-CM | POA: Diagnosis not present

## 2021-03-29 DIAGNOSIS — Z681 Body mass index (BMI) 19 or less, adult: Secondary | ICD-10-CM | POA: Diagnosis not present

## 2021-03-29 DIAGNOSIS — R21 Rash and other nonspecific skin eruption: Secondary | ICD-10-CM | POA: Diagnosis not present

## 2021-03-29 DIAGNOSIS — L659 Nonscarring hair loss, unspecified: Secondary | ICD-10-CM | POA: Diagnosis not present

## 2021-03-29 DIAGNOSIS — D649 Anemia, unspecified: Secondary | ICD-10-CM | POA: Diagnosis not present

## 2021-05-08 DIAGNOSIS — R109 Unspecified abdominal pain: Secondary | ICD-10-CM | POA: Diagnosis not present

## 2021-05-08 DIAGNOSIS — N390 Urinary tract infection, site not specified: Secondary | ICD-10-CM | POA: Diagnosis not present

## 2021-05-18 DIAGNOSIS — Z1339 Encounter for screening examination for other mental health and behavioral disorders: Secondary | ICD-10-CM | POA: Diagnosis not present

## 2021-05-18 DIAGNOSIS — E559 Vitamin D deficiency, unspecified: Secondary | ICD-10-CM | POA: Diagnosis not present

## 2021-05-18 DIAGNOSIS — M3210 Systemic lupus erythematosus, organ or system involvement unspecified: Secondary | ICD-10-CM | POA: Diagnosis not present

## 2021-05-18 DIAGNOSIS — D692 Other nonthrombocytopenic purpura: Secondary | ICD-10-CM | POA: Diagnosis not present

## 2021-05-18 DIAGNOSIS — D72819 Decreased white blood cell count, unspecified: Secondary | ICD-10-CM | POA: Diagnosis not present

## 2021-05-18 DIAGNOSIS — D638 Anemia in other chronic diseases classified elsewhere: Secondary | ICD-10-CM | POA: Diagnosis not present

## 2021-05-18 DIAGNOSIS — Z1331 Encounter for screening for depression: Secondary | ICD-10-CM | POA: Diagnosis not present

## 2021-05-18 DIAGNOSIS — M810 Age-related osteoporosis without current pathological fracture: Secondary | ICD-10-CM | POA: Diagnosis not present

## 2021-05-18 DIAGNOSIS — R030 Elevated blood-pressure reading, without diagnosis of hypertension: Secondary | ICD-10-CM | POA: Diagnosis not present

## 2021-05-18 DIAGNOSIS — F419 Anxiety disorder, unspecified: Secondary | ICD-10-CM | POA: Diagnosis not present

## 2021-05-18 DIAGNOSIS — D8989 Other specified disorders involving the immune mechanism, not elsewhere classified: Secondary | ICD-10-CM | POA: Diagnosis not present

## 2021-06-27 DIAGNOSIS — M810 Age-related osteoporosis without current pathological fracture: Secondary | ICD-10-CM | POA: Diagnosis not present

## 2021-08-24 DIAGNOSIS — R350 Frequency of micturition: Secondary | ICD-10-CM | POA: Diagnosis not present

## 2021-10-04 DIAGNOSIS — R0609 Other forms of dyspnea: Secondary | ICD-10-CM | POA: Diagnosis not present

## 2021-10-04 DIAGNOSIS — D649 Anemia, unspecified: Secondary | ICD-10-CM | POA: Diagnosis not present

## 2021-10-04 DIAGNOSIS — M65311 Trigger thumb, right thumb: Secondary | ICD-10-CM | POA: Diagnosis not present

## 2021-10-04 DIAGNOSIS — M329 Systemic lupus erythematosus, unspecified: Secondary | ICD-10-CM | POA: Diagnosis not present

## 2021-10-04 DIAGNOSIS — Z79899 Other long term (current) drug therapy: Secondary | ICD-10-CM | POA: Diagnosis not present

## 2021-10-04 DIAGNOSIS — M542 Cervicalgia: Secondary | ICD-10-CM | POA: Diagnosis not present

## 2021-10-04 DIAGNOSIS — R21 Rash and other nonspecific skin eruption: Secondary | ICD-10-CM | POA: Diagnosis not present

## 2021-10-04 DIAGNOSIS — M810 Age-related osteoporosis without current pathological fracture: Secondary | ICD-10-CM | POA: Diagnosis not present

## 2021-10-04 DIAGNOSIS — L659 Nonscarring hair loss, unspecified: Secondary | ICD-10-CM | POA: Diagnosis not present

## 2021-11-10 ENCOUNTER — Other Ambulatory Visit: Payer: Self-pay | Admitting: Internal Medicine

## 2021-11-10 DIAGNOSIS — Z1231 Encounter for screening mammogram for malignant neoplasm of breast: Secondary | ICD-10-CM

## 2021-11-27 DIAGNOSIS — R7989 Other specified abnormal findings of blood chemistry: Secondary | ICD-10-CM | POA: Diagnosis not present

## 2021-11-27 DIAGNOSIS — Z79899 Other long term (current) drug therapy: Secondary | ICD-10-CM | POA: Diagnosis not present

## 2021-11-27 DIAGNOSIS — D638 Anemia in other chronic diseases classified elsewhere: Secondary | ICD-10-CM | POA: Diagnosis not present

## 2021-11-27 DIAGNOSIS — E559 Vitamin D deficiency, unspecified: Secondary | ICD-10-CM | POA: Diagnosis not present

## 2021-11-30 ENCOUNTER — Encounter: Payer: Self-pay | Admitting: Gastroenterology

## 2021-12-01 DIAGNOSIS — Z0000 Encounter for general adult medical examination without abnormal findings: Secondary | ICD-10-CM | POA: Diagnosis not present

## 2021-12-01 DIAGNOSIS — F419 Anxiety disorder, unspecified: Secondary | ICD-10-CM | POA: Diagnosis not present

## 2021-12-01 DIAGNOSIS — R82998 Other abnormal findings in urine: Secondary | ICD-10-CM | POA: Diagnosis not present

## 2021-12-01 DIAGNOSIS — D72819 Decreased white blood cell count, unspecified: Secondary | ICD-10-CM | POA: Diagnosis not present

## 2021-12-01 DIAGNOSIS — M810 Age-related osteoporosis without current pathological fracture: Secondary | ICD-10-CM | POA: Diagnosis not present

## 2021-12-01 DIAGNOSIS — D638 Anemia in other chronic diseases classified elsewhere: Secondary | ICD-10-CM | POA: Diagnosis not present

## 2021-12-01 DIAGNOSIS — Z800 Family history of malignant neoplasm of digestive organs: Secondary | ICD-10-CM | POA: Diagnosis not present

## 2021-12-01 DIAGNOSIS — D8989 Other specified disorders involving the immune mechanism, not elsewhere classified: Secondary | ICD-10-CM | POA: Diagnosis not present

## 2021-12-01 DIAGNOSIS — M3210 Systemic lupus erythematosus, organ or system involvement unspecified: Secondary | ICD-10-CM | POA: Diagnosis not present

## 2021-12-01 DIAGNOSIS — D692 Other nonthrombocytopenic purpura: Secondary | ICD-10-CM | POA: Diagnosis not present

## 2021-12-01 DIAGNOSIS — Z23 Encounter for immunization: Secondary | ICD-10-CM | POA: Diagnosis not present

## 2021-12-21 ENCOUNTER — Ambulatory Visit (AMBULATORY_SURGERY_CENTER): Payer: Medicare PPO | Admitting: *Deleted

## 2021-12-21 VITALS — Ht 61.0 in | Wt 95.4 lb

## 2021-12-21 DIAGNOSIS — Z86010 Personal history of colonic polyps: Secondary | ICD-10-CM

## 2021-12-21 DIAGNOSIS — Z800 Family history of malignant neoplasm of digestive organs: Secondary | ICD-10-CM

## 2021-12-21 MED ORDER — NA SULFATE-K SULFATE-MG SULF 17.5-3.13-1.6 GM/177ML PO SOLN: 1.0000 | Freq: Once | ORAL | 0 refills | Status: AC

## 2021-12-21 NOTE — Progress Notes (Signed)

## 2021-12-28 DIAGNOSIS — M810 Age-related osteoporosis without current pathological fracture: Secondary | ICD-10-CM | POA: Diagnosis not present

## 2021-12-29 ENCOUNTER — Ambulatory Visit
Admission: RE | Admit: 2021-12-29 | Discharge: 2021-12-29 | Disposition: A | Discharge status: Home / Self Care | Payer: Medicare PPO | Source: Ambulatory Visit | Attending: Internal Medicine | Admitting: Internal Medicine

## 2021-12-29 DIAGNOSIS — Z1231 Encounter for screening mammogram for malignant neoplasm of breast: Secondary | ICD-10-CM

## 2022-01-17 DIAGNOSIS — N390 Urinary tract infection, site not specified: Secondary | ICD-10-CM | POA: Diagnosis not present

## 2022-01-18 ENCOUNTER — Encounter: Payer: Self-pay | Admitting: Gastroenterology

## 2022-01-18 ENCOUNTER — Encounter: Payer: Medicare PPO | Admitting: Gastroenterology

## 2022-01-19 ENCOUNTER — Encounter: Payer: Medicare PPO | Admitting: Gastroenterology

## 2022-01-20 ENCOUNTER — Encounter: Payer: Self-pay | Admitting: Certified Registered Nurse Anesthetist

## 2022-01-22 ENCOUNTER — Encounter: Payer: Self-pay | Admitting: Gastroenterology

## 2022-01-22 ENCOUNTER — Ambulatory Visit (AMBULATORY_SURGERY_CENTER): Payer: Medicare PPO | Admitting: Gastroenterology

## 2022-01-22 VITALS — BP 104/65 | HR 73 | Temp 96.6°F | Resp 14 | Ht 61.0 in | Wt 95.4 lb

## 2022-01-22 DIAGNOSIS — Z09 Encounter for follow-up examination after completed treatment for conditions other than malignant neoplasm: Secondary | ICD-10-CM

## 2022-01-22 DIAGNOSIS — Z800 Family history of malignant neoplasm of digestive organs: Secondary | ICD-10-CM | POA: Diagnosis not present

## 2022-01-22 DIAGNOSIS — Z1211 Encounter for screening for malignant neoplasm of colon: Secondary | ICD-10-CM | POA: Diagnosis not present

## 2022-01-22 DIAGNOSIS — Z86010 Personal history of colonic polyps: Secondary | ICD-10-CM | POA: Diagnosis not present

## 2022-01-22 MED ORDER — SODIUM CHLORIDE 0.9 % IV SOLN: 500.0000 mL | INTRAVENOUS | Status: DC

## 2022-01-22 NOTE — Progress Notes (Unsigned)
Report given to PACU, vss 

## 2022-01-22 NOTE — Progress Notes (Unsigned)
[facility] History and Physical   Primary Care Physician:  Tisovec, Fransico Him, MD   Reason for Procedure:  History of adenomatous colon polyps, family h/o colon cancer  Plan:    Surveillance colonoscopy with possible interventions as needed     HPI: [name] is a very pleasant 72 y.o. female here for surveillance colonoscopy. Denies any nausea, vomiting, abdominal pain, melena or bright red blood per rectum  The risks and benefits as well as alternatives of endoscopic procedure(s) have been discussed and reviewed. All questions answered. The patient agrees to proceed.    Past Medical History:  Diagnosis Date   Allergy    SEASONAL   Anemia    Arthritis    Cataract    BILATERAL,REMOVED   Diverticulosis 01/15/2005   Dr Oneida Alar   Febrile illness    Lupus ([place])    Osteoporosis    Pyelonephritis    pt denies   Rheumatoid arthritis(714.0)    Sickle cell trait [facility])     Past Surgical History:  Procedure Laterality Date   ABDOMINAL HYSTERECTOMY     BUNIONECTOMY     COLONOSCOPY  10/02/2005   SLF: Rare diverticulosis.  Otherwise, no polyps, masses, inflammatory changes or vascular ectasia seen/ Normal retroflexed view of the rectum   COLONOSCOPY  12/07/2011   Procedure: COLONOSCOPY;  Surgeon: Danie Binder, MD;  [facility];  Service: Endoscopy;  Laterality: N/A;  3:30   ESOPHAGEAL MANOMETRY N/A 02/11/2015   Procedure: ESOPHAGEAL MANOMETRY (EM);  Surgeon: Mauri Pole, MD;  Location: [facility];  Service: Endoscopy;  Laterality: N/A;   UPPER GASTROINTESTINAL ENDOSCOPY      Prior to Admission medications   Medication Sig Start Date End Date Taking? Authorizing Provider  Cholecalciferol (VITAMIN D3 PO) Take 1 tablet daily by mouth.   Yes [provider]  denosumab (PROLIA) 60 MG/ML SOSY injection Inject 60 mg into the skin every 6 (six) months.   Yes [provider]  FOLIC ACID PO Take 1 tablet daily by mouth.   Yes  [provider]  hydroxychloroquine (PLAQUENIL) 200 MG tablet Take 200 mg by mouth daily. 11/08/14  Yes [provider]  Multiple Vitamin (MULTIVITAMIN) tablet Take 1 tablet by mouth daily.     Yes [provider]  Polyethyl Glycol-Propyl Glycol (SYSTANE OP) Apply 1 drop to eye daily as needed (dry eyes).   Yes [provider]  fluticasone (FLONASE) 50 MCG/ACT nasal spray Place 1 spray into both nostrils daily.    [provider]  predniSONE (DELTASONE) 10 MG tablet Take 10 mg by mouth as needed. 11/08/14   [provider]  zolpidem (AMBIEN) 10 MG tablet Take 1 tablet at bedtime as needed by mouth. Patient not taking: Reported on 12/21/2021    [provider]    Current Outpatient Medications  Medication Sig Dispense Refill   Cholecalciferol (VITAMIN D3 PO) Take 1 tablet daily by mouth.     denosumab (PROLIA) 60 MG/ML SOSY injection Inject 60 mg into the skin every 6 (six) months.     FOLIC ACID PO Take 1 tablet daily by mouth.     hydroxychloroquine (PLAQUENIL) 200 MG tablet Take 200 mg by mouth daily.     Multiple Vitamin (MULTIVITAMIN) tablet Take 1 tablet by mouth daily.       Polyethyl Glycol-Propyl Glycol (SYSTANE OP) Apply 1 drop to eye daily as needed (dry eyes).     fluticasone (FLONASE) 50 MCG/ACT nasal spray Place  1 spray into both nostrils daily.     predniSONE (DELTASONE) 10 MG tablet Take 10 mg by mouth as needed.     zolpidem (AMBIEN) 10 MG tablet Take 1 tablet at bedtime as needed by mouth. (Patient not taking: Reported on 12/21/2021)     Current Facility-Administered Medications  Medication Dose Route Frequency Provider Last Rate Last Admin   0.9 %  sodium chloride infusion  500 mL Intravenous Continuous Andri Prestia, Venia Minks, MD        Allergies as of 01/22/2022 - Review Complete 01/22/2022  Allergen Reaction Noted   Ciprofloxacin Swelling 06/21/2010   Nsaids  10/10/2014   Sudafed [pseudoephedrine] Tinitus  12/03/2016   Sulfa drugs cross reactors Other (See Comments) 05/30/2010   Tolmetin Other (See Comments) 10/10/2014   Aspirin Other (See Comments) 02/27/2016    Family History  Problem Relation Age of Onset   Colon cancer Mother 90   Diabetes Father    Colon polyps Sister    Colon cancer Sister    Colon polyps Brother    Hypertension Other        two brothers, sister    Esophageal cancer Neg Hx    Rectal cancer Neg Hx    Stomach cancer Neg Hx    Crohn's disease Neg Hx    Ulcerative colitis Neg Hx    Heart disease Neg Hx     Social History   Socioeconomic History   Marital status: Married    Spouse name: Not on file   Number of children: 3   Years of education: Not on file   Highest education level: Not on file  Occupational History   Occupation: retired  Tobacco Use   Smoking status: Never    Passive exposure: Past (FATHER SMOKED,WHEN YOUNGER)   Smokeless tobacco: Never  Vaping Use   Vaping Use: Never used  Substance and Sexual Activity   Alcohol use: Not Currently    Comment: teaspoon at night whiskey to help me sleep per pt   Drug use: No   Sexual activity: Yes    Birth control/protection: Surgical  Other Topics Concern   Not on file  Social History Narrative   Not on file   Social Determinants of Health   Financial Resource Strain: Not on file  Food Insecurity: Not on file  Transportation Needs: Not on file  Physical Activity: Not on file  Stress: Not on file  Social Connections: Not on file  Intimate Partner Violence: Not on file    Review of Systems:  All other review of systems negative except as mentioned in the HPI.  Physical Exam: Vital signs in last 24 hours: Blood Pressure 110/74   Pulse (Abnormal) 121   Temperature (Abnormal) 96.6 F (35.9 C)   Height '5\' 1"'$  (1.549 m)   Weight 95 lb 6.4 oz (43.3 kg)   Oxygen Saturation 100%   Body Mass Index 18.03 kg/m  General:   Alert, NAD Lungs:  Clear .   Heart:  Regular rate and  rhythm Abdomen:  Soft, nontender and nondistended. Neuro/Psych:  Alert and cooperative. Normal mood and affect. A and O x 3  Reviewed labs, radiology imaging, old records and pertinent past GI work up  Patient is appropriate for planned procedure(s) and anesthesia in an ambulatory setting   K. Denzil Magnuson , MD [phone]

## 2022-01-22 NOTE — Progress Notes (Signed)
Vital signs checked by: DT  The patient states no changes in medical or surgical history since pre-visit screening on 12/21/21.

## 2022-01-22 NOTE — Op Note (Signed)
[facility] Patient Name: [name] Procedure Date: 01/22/2022 4:15 PM MRN: [id number] Endoscopist: Mauri Pole , MD, [id number] Age: 72 Referring MD:  Date of Birth: 1950-08-22 Gender: Female Account #: [id number] Procedure:                Colonoscopy Indications:              Screening in patient at increased risk: Family                            history of 1st-degree relative with colorectal                            cancer, High risk colon cancer surveillance:                            Personal history of colonic polyps Medicines:                Monitored Anesthesia Care Procedure:                Pre-Anesthesia Assessment:                           - Prior to the procedure, a History and Physical                            was performed, and patient medications and                            allergies were reviewed. The patient's tolerance of                            previous anesthesia was also reviewed. The risks                            and benefits of the procedure and the sedation                            options and risks were discussed with the patient.                            All questions were answered, and informed consent                            was obtained. Prior Anticoagulants: The patient has                            taken no anticoagulant or antiplatelet agents. ASA                            Grade Assessment: III - A patient with severe                            systemic disease. After reviewing the risks and  benefits, the patient was deemed in satisfactory                            condition to undergo the procedure.                           After obtaining informed consent, the colonoscope                            was passed under direct vision. Throughout the                            procedure, the patient's blood pressure, pulse, and                            oxygen saturations were  monitored continuously. The                            PCF-HQ190L Colonoscope was introduced through the                            anus and advanced to the the cecum, identified by                            appendiceal orifice and ileocecal valve. The                            colonoscopy was performed without difficulty. The                            patient tolerated the procedure well. The quality                            of the bowel preparation was good. The ileocecal                            valve, appendiceal orifice, and rectum were                            photographed. Scope In: 4:25:57 PM Scope Out: 4:38:30 PM Scope Withdrawal Time: 0 hours 8 minutes 33 seconds  Total Procedure Duration: 0 hours 12 minutes 33 seconds  Findings:                 The perianal and digital rectal examinations were                            normal.                           Non-bleeding external and internal hemorrhoids were                            found during retroflexion. The hemorrhoids were  medium-sized.                           The exam was otherwise without abnormality. Complications:            No immediate complications. Estimated Blood Loss:     Estimated blood loss was minimal. Impression:               - Non-bleeding external and internal hemorrhoids.                           - The examination was otherwise normal.                           - No specimens collected. Recommendation:           - Patient has a contact number available for                            emergencies. The signs and symptoms of potential                            delayed complications were discussed with the                            patient. Return to normal activities tomorrow.                            Written discharge instructions were provided to the                            patient.                           - Resume previous diet.                           -  Continue present medications.                           - No repeat colonoscopy due to age. Mauri Pole, MD 01/22/2022 4:46:57 PM This report has been signed electronically.

## 2022-01-22 NOTE — Patient Instructions (Signed)
Resume all of your previous medication.  Read all of the handouts given to you by your recovery room nurse.  YOU HAD AN ENDOSCOPIC PROCEDURE TODAY AT [facility]:   Refer to the procedure report that was given to you for any specific questions about what was found during the examination.  If the procedure report does not answer your questions, please call your gastroenterologist to clarify.  If you requested that your care partner not be given the details of your procedure findings, then the procedure report has been included in a sealed envelope for you to review at your convenience later.  YOU SHOULD EXPECT: Some feelings of bloating in the abdomen. Passage of more gas than usual.  Walking can help get rid of the air that was put into your GI tract during the procedure and reduce the bloating. If you had a lower endoscopy (such as a colonoscopy or flexible sigmoidoscopy) you may notice spotting of blood in your stool or on the toilet paper. If you underwent a bowel prep for your procedure, you may not have a normal bowel movement for a few days.  Please Note:  You might notice some irritation and congestion in your nose or some drainage.  This is from the oxygen used during your procedure.  There is no need for concern and it should clear up in a day or so.  SYMPTOMS TO REPORT IMMEDIATELY:  Following lower endoscopy (colonoscopy or flexible sigmoidoscopy):  Excessive amounts of blood in the stool  Significant tenderness or worsening of abdominal pains  Swelling of the abdomen that is new, acute  Fever of 100F or higher  For urgent or emergent issues, a gastroenterologist can be reached at any hour by calling [phone]. Do not use MyChart messaging for urgent concerns.    DIET:  We do recommend a small meal at first, but then you may proceed to your regular diet.  Drink plenty of fluids but you should avoid alcoholic beverages for 24 hours.  ACTIVITY:  You should plan to  take it easy for the rest of today and you should NOT DRIVE or use heavy machinery until tomorrow (because of the sedation medicines used during the test).    FOLLOW UP: Our staff will call the number listed on your records the next business day following your procedure.  We will call around 7:15- 8:00 am to check on you and address any questions or concerns that you may have regarding the information given to you following your procedure. If we do not reach you, we will leave a message.      SIGNATURES/CONFIDENTIALITY: You and/or your care partner have signed paperwork which will be entered into your electronic medical record.  These signatures attest to the fact that that the information above on your After Visit Summary has been reviewed and is understood.  Full responsibility of the confidentiality of this discharge information lies with you and/or your care-partner.

## 2022-01-23 ENCOUNTER — Encounter: Payer: Self-pay | Admitting: Gastroenterology

## 2022-01-23 ENCOUNTER — Telehealth: Payer: Self-pay

## 2022-01-23 NOTE — Telephone Encounter (Signed)
Left message on follow up call. 

## 2022-01-31 DIAGNOSIS — Z79899 Other long term (current) drug therapy: Secondary | ICD-10-CM | POA: Diagnosis not present

## 2022-01-31 DIAGNOSIS — M3210 Systemic lupus erythematosus, organ or system involvement unspecified: Secondary | ICD-10-CM | POA: Diagnosis not present

## 2022-01-31 DIAGNOSIS — Z961 Presence of intraocular lens: Secondary | ICD-10-CM | POA: Diagnosis not present

## 2022-01-31 DIAGNOSIS — H524 Presbyopia: Secondary | ICD-10-CM | POA: Diagnosis not present

## 2022-04-04 DIAGNOSIS — M810 Age-related osteoporosis without current pathological fracture: Secondary | ICD-10-CM | POA: Diagnosis not present

## 2022-04-04 DIAGNOSIS — Z79899 Other long term (current) drug therapy: Secondary | ICD-10-CM | POA: Diagnosis not present

## 2022-04-04 DIAGNOSIS — M329 Systemic lupus erythematosus, unspecified: Secondary | ICD-10-CM | POA: Diagnosis not present

## 2022-04-04 DIAGNOSIS — M542 Cervicalgia: Secondary | ICD-10-CM | POA: Diagnosis not present

## 2022-04-04 DIAGNOSIS — Z681 Body mass index (BMI) 19 or less, adult: Secondary | ICD-10-CM | POA: Diagnosis not present

## 2022-04-04 DIAGNOSIS — L659 Nonscarring hair loss, unspecified: Secondary | ICD-10-CM | POA: Diagnosis not present

## 2022-04-04 DIAGNOSIS — M65311 Trigger thumb, right thumb: Secondary | ICD-10-CM | POA: Diagnosis not present

## 2022-04-04 DIAGNOSIS — D649 Anemia, unspecified: Secondary | ICD-10-CM | POA: Diagnosis not present

## 2022-04-04 DIAGNOSIS — R21 Rash and other nonspecific skin eruption: Secondary | ICD-10-CM | POA: Diagnosis not present

## 2022-05-09 DIAGNOSIS — N390 Urinary tract infection, site not specified: Secondary | ICD-10-CM | POA: Diagnosis not present

## 2022-05-30 DIAGNOSIS — R809 Proteinuria, unspecified: Secondary | ICD-10-CM | POA: Diagnosis not present

## 2022-05-30 DIAGNOSIS — R799 Abnormal finding of blood chemistry, unspecified: Secondary | ICD-10-CM | POA: Diagnosis not present

## 2022-05-30 DIAGNOSIS — R030 Elevated blood-pressure reading, without diagnosis of hypertension: Secondary | ICD-10-CM | POA: Diagnosis not present

## 2022-05-30 DIAGNOSIS — D692 Other nonthrombocytopenic purpura: Secondary | ICD-10-CM | POA: Diagnosis not present

## 2022-05-30 DIAGNOSIS — D638 Anemia in other chronic diseases classified elsewhere: Secondary | ICD-10-CM | POA: Diagnosis not present

## 2022-05-30 DIAGNOSIS — D8989 Other specified disorders involving the immune mechanism, not elsewhere classified: Secondary | ICD-10-CM | POA: Diagnosis not present

## 2022-05-30 DIAGNOSIS — M810 Age-related osteoporosis without current pathological fracture: Secondary | ICD-10-CM | POA: Diagnosis not present

## 2022-05-30 DIAGNOSIS — N390 Urinary tract infection, site not specified: Secondary | ICD-10-CM | POA: Diagnosis not present

## 2022-05-30 DIAGNOSIS — M3210 Systemic lupus erythematosus, organ or system involvement unspecified: Secondary | ICD-10-CM | POA: Diagnosis not present

## 2022-07-02 DIAGNOSIS — M810 Age-related osteoporosis without current pathological fracture: Secondary | ICD-10-CM | POA: Diagnosis not present

## 2022-10-03 DIAGNOSIS — D649 Anemia, unspecified: Secondary | ICD-10-CM | POA: Diagnosis not present

## 2022-10-03 DIAGNOSIS — Z79899 Other long term (current) drug therapy: Secondary | ICD-10-CM | POA: Diagnosis not present

## 2022-10-03 DIAGNOSIS — M542 Cervicalgia: Secondary | ICD-10-CM | POA: Diagnosis not present

## 2022-10-03 DIAGNOSIS — L659 Nonscarring hair loss, unspecified: Secondary | ICD-10-CM | POA: Diagnosis not present

## 2022-10-03 DIAGNOSIS — M65311 Trigger thumb, right thumb: Secondary | ICD-10-CM | POA: Diagnosis not present

## 2022-10-03 DIAGNOSIS — R21 Rash and other nonspecific skin eruption: Secondary | ICD-10-CM | POA: Diagnosis not present

## 2022-10-03 DIAGNOSIS — M810 Age-related osteoporosis without current pathological fracture: Secondary | ICD-10-CM | POA: Diagnosis not present

## 2022-10-03 DIAGNOSIS — M329 Systemic lupus erythematosus, unspecified: Secondary | ICD-10-CM | POA: Diagnosis not present

## 2022-10-03 DIAGNOSIS — Z681 Body mass index (BMI) 19 or less, adult: Secondary | ICD-10-CM | POA: Diagnosis not present

## 2022-10-04 DIAGNOSIS — N390 Urinary tract infection, site not specified: Secondary | ICD-10-CM | POA: Diagnosis not present

## 2022-11-01 ENCOUNTER — Encounter: Payer: Self-pay | Admitting: Oncology

## 2022-11-01 ENCOUNTER — Inpatient Hospital Stay: Payer: Medicare PPO | Attending: Oncology | Admitting: Oncology

## 2022-11-01 ENCOUNTER — Inpatient Hospital Stay (HOSPITAL_BASED_OUTPATIENT_CLINIC_OR_DEPARTMENT_OTHER): Payer: Medicare PPO | Admitting: Oncology

## 2022-11-01 VITALS — BP 118/78 | HR 87 | Temp 98.7°F | Resp 16 | Ht 61.0 in | Wt 94.2 lb

## 2022-11-01 DIAGNOSIS — D709 Neutropenia, unspecified: Secondary | ICD-10-CM

## 2022-11-01 DIAGNOSIS — Z800 Family history of malignant neoplasm of digestive organs: Secondary | ICD-10-CM | POA: Insufficient documentation

## 2022-11-01 DIAGNOSIS — D72819 Decreased white blood cell count, unspecified: Secondary | ICD-10-CM | POA: Insufficient documentation

## 2022-11-01 DIAGNOSIS — M329 Systemic lupus erythematosus, unspecified: Secondary | ICD-10-CM | POA: Insufficient documentation

## 2022-11-01 DIAGNOSIS — Z79899 Other long term (current) drug therapy: Secondary | ICD-10-CM | POA: Diagnosis not present

## 2022-11-01 DIAGNOSIS — R5383 Other fatigue: Secondary | ICD-10-CM | POA: Diagnosis not present

## 2022-11-01 LAB — COMPREHENSIVE METABOLIC PANEL
ALT: 19 U/L (ref 0–44)
AST: 22 U/L (ref 15–41)
Albumin: 4.2 g/dL (ref 3.5–5.0)
Alkaline Phosphatase: 34 U/L — ABNORMAL LOW (ref 38–126)
Anion gap: 9 (ref 5–15)
BUN: 15 mg/dL (ref 8–23)
CO2: 26 mmol/L (ref 22–32)
Calcium: 9.2 mg/dL (ref 8.9–10.3)
Chloride: 103 mmol/L (ref 98–111)
Creatinine, Ser: 0.79 mg/dL (ref 0.44–1.00)
GFR, Estimated: >60 mL/min (ref >60)
Glucose, Bld: 93 mg/dL (ref 70–99)
Potassium: 4.2 mmol/L (ref 3.5–5.1)
Sodium: 138 mmol/L (ref 135–145)
Total Bilirubin: 0.7 mg/dL (ref 0.3–1.2)
Total Protein: 7.4 g/dL (ref 6.5–8.1)

## 2022-11-01 LAB — CBC WITH DIFFERENTIAL/PLATELET
Abs Immature Granulocytes: 0.01 10*3/uL (ref 0.00–0.07)
Basophils Absolute: 0 10*3/uL (ref 0.0–0.1)
Basophils Relative: 0 %
Eosinophils Absolute: 0 10*3/uL (ref 0.0–0.5)
Eosinophils Relative: 1 %
HCT: 36.8 % (ref 36.0–46.0)
Hemoglobin: 12.4 g/dL (ref 12.0–15.0)
Immature Granulocytes: 0 %
Lymphocytes Relative: 31 %
Lymphs Abs: 1.5 10*3/uL (ref 0.7–4.0)
MCH: 28.2 pg (ref 26.0–34.0)
MCHC: 33.7 g/dL (ref 30.0–36.0)
MCV: 83.6 fL (ref 80.0–100.0)
Monocytes Absolute: 0.3 10*3/uL (ref 0.1–1.0)
Monocytes Relative: 7 %
Neutro Abs: 2.8 10*3/uL (ref 1.7–7.7)
Neutrophils Relative %: 61 %
Platelets: 232 10*3/uL (ref 150–400)
RBC: 4.4 MIL/uL (ref 3.87–5.11)
RDW: 12.3 % (ref 11.5–15.5)
WBC: 4.6 10*3/uL (ref 4.0–10.5)
nRBC: 0 % (ref 0.0–0.2)

## 2022-11-01 LAB — HEPATITIS PANEL, ACUTE
HCV Ab: NONREACTIVE
Hep A IgM: NONREACTIVE
Hep B C IgM: NONREACTIVE
Hepatitis B Surface Ag: NONREACTIVE

## 2022-11-01 LAB — FOLATE: Folate: 13.4 ng/mL (ref >5.9)

## 2022-11-01 LAB — RETICULOCYTES
Immature Retic Fract: 10.1 % (ref 2.3–15.9)
RBC.: 4.58 MIL/uL (ref 3.87–5.11)
Retic Count, Absolute: 41.2 10*3/uL (ref 19.0–186.0)
Retic Ct Pct: 0.9 % (ref 0.4–3.1)

## 2022-11-01 LAB — VITAMIN B12: Vitamin B-12: 1045 pg/mL — ABNORMAL HIGH (ref 180–914)

## 2022-11-01 LAB — IRON AND TIBC
Iron: 104 ug/dL (ref 28–170)
Saturation Ratios: 25 % (ref 10.4–31.8)
TIBC: 418 ug/dL (ref 250–450)
UIBC: 314 ug/dL

## 2022-11-01 LAB — HIV ANTIBODY (ROUTINE TESTING W REFLEX): HIV Screen 4th Generation wRfx: NONREACTIVE

## 2022-11-01 LAB — FERRITIN: Ferritin: 55 ng/mL (ref 11–307)

## 2022-11-01 NOTE — Progress Notes (Signed)
[facility] at [facility] HEMATOLOGY NEW VISIT  Tisovec, Adelfa Koh, MD  REASON FOR REFERRAL: Leukopenia   HISTORY OF PRESENT ILLNESS: [name] 72 y.o. female referred for leukopenia. She was diagnosed with lupus in 2016, but believes she had the condition for several years prior due to persistent symptoms. She is currently managed with Plaquenil and occasional prednisone. She also started Prolia injections in December 2022 for bone strength, and has experienced some of the side effects associated with this medication.  The patient reports occasional fatigue and intermittent fevers that resolve spontaneously. She denies night sweats, significant weight loss, abdominal pain, and palpable lymph nodes. She has noticed frequent kidney or bladder infections, which she attributes to the Prolia injections. She also reports occasional missed meals and medication doses due to caregiving responsibilities for a sister in a nursing home.  The patient has a family history of colon cancer, with her mother passing away from the disease at the age of 68. She denies any personal history of smoking or significant alcohol use.   I have reviewed the past medical history, past surgical history, social history and family history with the patient   ALLERGIES:  is allergic to ciprofloxacin, nsaids, sudafed [pseudoephedrine], sulfa drugs cross reactors, tolmetin, and aspirin.  MEDICATIONS:  Current Outpatient Medications  Medication Sig Dispense Refill   Cholecalciferol (VITAMIN D3 PO) Take 1 tablet daily by mouth.     denosumab (PROLIA) 60 MG/ML SOSY injection Inject 60 mg into the skin every 6 (six) months.     fluticasone (FLONASE) 50 MCG/ACT nasal spray Place 1 spray into both nostrils daily.     FOLIC ACID PO Take 1 tablet daily by mouth.     hydroxychloroquine (PLAQUENIL) 200 MG tablet Take 200 mg by mouth daily.     Multiple Vitamin (MULTIVITAMIN) tablet Take 1 tablet by  mouth daily.       Polyethyl Glycol-Propyl Glycol (SYSTANE OP) Apply 1 drop to eye daily as needed (dry eyes).     predniSONE (DELTASONE) 10 MG tablet Take 10 mg by mouth as needed.     zolpidem (AMBIEN) 10 MG tablet Take 1 tablet by mouth at bedtime as needed.     No current facility-administered medications for this visit.     REVIEW OF SYSTEMS:   Constitutional: Denies fevers, chills or night sweats Eyes: Denies blurriness of vision Ears, nose, mouth, throat, and face: Denies mucositis or sore throat Respiratory: Denies cough, dyspnea or wheezes Cardiovascular: Denies palpitation, chest discomfort or lower extremity swelling Gastrointestinal:  Denies nausea, heartburn or change in bowel habits Skin: Denies abnormal skin rashes Lymphatics: Denies new lymphadenopathy or easy bruising Neurological:Denies numbness, tingling or new weaknesses Behavioral/Psych: Mood is stable, no new changes  All other systems were reviewed with the patient and are negative.  PHYSICAL EXAMINATION:   Vitals:   11/01/22 1103  BP: 118/78  Pulse: 87  Resp: 16  Temp: 98.7 F (37.1 C)    GENERAL:alert, no distress and comfortable NECK: no palpable lymphadenopathy LUNGS: clear to auscultation and percussion with normal breathing effort HEART: regular rate & rhythm and no murmurs and no lower extremity edema ABDOMEN:abdomen soft, non-tender and normal bowel sounds Musculoskeletal:no cyanosis of digits and no clubbing  NEURO: alert & oriented x 3 with fluent speech   LABORATORY DATA:  I have reviewed the data as listed under media from [facility] rheumatology Labs dated 10/05/2022: CBC: WBC: 2.7, hemoglobin: 11.6, MCV: 91, platelets: 205, ANC: 900 CMP:  Creatinine: 0.8, albumin: 4.3, globulin: 2.7, ALP : 40, AST, ALT: WNL  Lab Results  Component Value Date   WBC 4.6 11/01/2022   NEUTROABS 2.8 11/01/2022   HGB 12.4 11/01/2022   HCT 36.8 11/01/2022   MCV 83.6 11/01/2022   PLT 232 11/01/2022       Component Value Date/Time   NA 138 11/01/2022 1123   K 4.2 11/01/2022 1123   CL 103 11/01/2022 1123   CO2 26 11/01/2022 1123   GLUCOSE 93 11/01/2022 1123   BUN 15 11/01/2022 1123   CREATININE 0.79 11/01/2022 1123   CREATININE 0.59 06/22/2010 0000   CALCIUM 9.2 11/01/2022 1123   PROT 7.4 11/01/2022 1123   ALBUMIN 4.2 11/01/2022 1123   AST 22 11/01/2022 1123   ALT 19 11/01/2022 1123   ALKPHOS 34 (L) 11/01/2022 1123   BILITOT 0.7 11/01/2022 1123   GFRNONAA >60 11/01/2022 1123   GFRNONAA >60 06/22/2010 0000   GFRAA >60 08/10/2018 1625   GFRAA >60 06/22/2010 0000      Chemistry      Component Value Date/Time   NA 138 11/01/2022 1123   K 4.2 11/01/2022 1123   CL 103 11/01/2022 1123   CO2 26 11/01/2022 1123   BUN 15 11/01/2022 1123   CREATININE 0.79 11/01/2022 1123   CREATININE 0.59 06/22/2010 0000      Component Value Date/Time   CALCIUM 9.2 11/01/2022 1123   ALKPHOS 34 (L) 11/01/2022 1123   AST 22 11/01/2022 1123   ALT 19 11/01/2022 1123   BILITOT 0.7 11/01/2022 1123      ASSESSMENT & PLAN:  Patient is a 72 year old female past medical history of SLE and is on Plaquenil since 2016 .  Leukopenia Noted on recent labs from rheumatology. Patient has a history of lupus and is on Plaquenil, both of which can cause leukopenia. No symptoms of infection or malignancy. -Ordering complete blood count, flow cytometry, hepatitis, and HIV testing to rule out other causes of leukopenia. -Plan to recheck white blood cell count in 1 month.  SLE (systemic lupus erythematosus related syndrome) (HCC) Diagnosed in 2016, currently managed with Plaquenil. Patient reports occasional use of prednisone. -Continue current management and follow-up with rheumatology   Orders Placed This Encounter  Procedures   CBC with Differential/Platelet    Standing Status:   Future    Number of Occurrences:   1    Standing Expiration Date:   11/01/2023   Vitamin B12    Standing Status:    Future    Number of Occurrences:   1    Standing Expiration Date:   11/01/2023   Ferritin    Standing Status:   Future    Number of Occurrences:   1    Standing Expiration Date:   11/01/2023   Folate    Standing Status:   Future    Number of Occurrences:   1    Standing Expiration Date:   11/01/2023   Iron and TIBC    Standing Status:   Future    Number of Occurrences:   1    Standing Expiration Date:   11/01/2023   Hepatitis panel, acute    Standing Status:   Future    Number of Occurrences:   1    Standing Expiration Date:   11/01/2023   HIV antibody (with reflex)    Standing Status:   Future    Number of Occurrences:   1    Standing Expiration Date:  11/01/2023   Reticulocytes    Standing Status:   Future    Number of Occurrences:   1    Standing Expiration Date:   11/01/2023   Comprehensive metabolic panel    Standing Status:   Future    Number of Occurrences:   1    Standing Expiration Date:   11/01/2023   Flow Cytometry, Peripheral Blood (Oncology)    Standing Status:   Standing    Number of Occurrences:   1    The total time spent in the appointment was 30 minutes encounter with patients including review of chart and various tests results, discussions about plan of care and coordination of care plan   All questions were answered. The patient knows to call the clinic with any problems, questions or concerns. No barriers to learning was detected.   Cindie Crumbly, MD 10/17/202412:52 PM

## 2022-11-01 NOTE — Assessment & Plan Note (Signed)
Noted on recent labs from rheumatology. Patient has a history of lupus and is on Plaquenil, both of which can cause leukopenia. No symptoms of infection or malignancy. -Ordering complete blood count, flow cytometry, hepatitis, and HIV testing to rule out other causes of leukopenia. -Plan to recheck white blood cell count in 1 month.

## 2022-11-01 NOTE — Patient Instructions (Signed)
VISIT SUMMARY:  During your visit, we discussed your concerns about a low white blood cell count, your lupus management, the Prolia injections you started for bone strength, and your nutrition. You also mentioned occasional fatigue, intermittent fevers, and frequent kidney or bladder infections.  YOUR PLAN:  -LOW WHITE BLOOD CELL COUNT: This condition, also known as leukopenia, can be caused by lupus or the medication Plaquenil. We will conduct further tests to rule out other causes and recheck your white blood cell count in a month.  -LUPUS: You have been managing your lupus with Plaquenil and occasional prednisone. We will continue with this treatment plan.   INSTRUCTIONS:  Please ensure to have regular meals and continue with your current supplementation. We will conduct a complete blood count, flow cytometry, hepatitis, and HIV testing to rule out other causes of leukopenia. We will recheck your white blood cell count in a month. Please continue with your current lupus management and Prolia injections for bone health.

## 2022-11-01 NOTE — Assessment & Plan Note (Signed)
Diagnosed in 2016, currently managed with Plaquenil. Patient reports occasional use of prednisone. -Continue current management and follow-up with rheumatology

## 2022-11-02 NOTE — Progress Notes (Signed)
Pt called and aware of results.

## 2022-11-13 ENCOUNTER — Other Ambulatory Visit: Payer: Self-pay | Admitting: Internal Medicine

## 2022-11-13 DIAGNOSIS — Z1231 Encounter for screening mammogram for malignant neoplasm of breast: Secondary | ICD-10-CM

## 2022-11-23 ENCOUNTER — Inpatient Hospital Stay: Payer: Medicare PPO

## 2022-11-29 ENCOUNTER — Encounter: Payer: Self-pay | Admitting: Oncology

## 2022-11-29 ENCOUNTER — Inpatient Hospital Stay: Payer: Medicare PPO | Attending: Oncology | Admitting: Oncology

## 2022-11-29 VITALS — BP 135/82 | HR 77 | Temp 98.7°F | Resp 17

## 2022-11-29 DIAGNOSIS — D72819 Decreased white blood cell count, unspecified: Secondary | ICD-10-CM | POA: Diagnosis not present

## 2022-11-29 DIAGNOSIS — M329 Systemic lupus erythematosus, unspecified: Secondary | ICD-10-CM

## 2022-11-29 NOTE — Patient Instructions (Signed)
VISIT SUMMARY:  You came in today for a follow-up after experiencing a low white blood cell count. You are feeling well and have no new symptoms or concerns. Your recent labs show that your white blood cell count is back to normal.  YOUR PLAN:  -LOW WHITE BLOOD CELL COUNT: A low white blood cell count can make it harder for your body to fight infections. Your low count was likely due to your medication, Plaquenil, and a recent infection. Your current labs show that your white blood cell count is normal again. You should continue taking Plaquenil as it is effectively managing your condition. You are being discharged from the clinic, but please follow up with your primary care provider. If any future concerns arise, your primary care provider can refer you back to this clinic.  INSTRUCTIONS:  Please follow up with your primary care provider. If any future concerns arise, your primary care provider can refer you back to this clinic.

## 2022-11-29 NOTE — Progress Notes (Signed)
[facility] at [facility] HEMATOLOGY FOLLOW-UP VISIT  Tisovec, Adelfa Koh, MD  REASON FOR FOLLOW-UP: Leukopenia  ASSESSMENT & PLAN:  Patient is a 72 year old female past medical history of SLE and is on Plaquenil since 2016 following for leukopenia  Leukopenia Initially low, possibly due to Plaquenil and concurrent infection. Current labs show normal white blood cell count. No current symptoms of infection or disease.  Hepatitis panel, HIV negative.  Normal B12, folate, iron levels.  Did not send a flow cytometry as the WBC came back normal on repeat testing. -Discharge from clinic. -Follow up with primary care provider. -Primary care provider can refer back to this clinic if future concerns arise.  SLE (systemic lupus erythematosus related syndrome) (HCC) Diagnosed in 2016, currently managed with Plaquenil. Patient reports occasional use of prednisone. -Continue current management and follow-up with rheumatology   The total time spent in the appointment was 15 minutes encounter with patients including review of chart and various tests results, discussions about plan of care and coordination of care plan   All questions were answered. The patient knows to call the clinic with any problems, questions or concerns. No barriers to learning was detected.  Cindie Crumbly, MD 11/14/20241:15 PM   SUMMARY OF HEMATOLOGIC HISTORY: Presented with a white count of 2.7 and an ANC of 900.  Repeat testing showed normal WBC -HIV, hepatitis panel: Negative -Vitamin B12, folate: Normal -Ferritin: Normal   INTERVAL HISTORY: Smt B [place] 72 y.o. female with a past medical history of SLE on Plaquenil following up for leukopenia.Patient has no complaints todayShe reports feeling well with no fatigue, night sweats, fever, chills, or weight loss. Her appetite is good and she is maintaining her usual activities. She had a concurrent kidney or bladder infection at the time of  the low white blood cell count. She denies any new symptoms or concerns.  We discussed the lab results that her CBC was normal, hepatitis and HIV panel came back normal.  Normal vitamin B12 and folate levels.  Discussed that we can send flow cytometry because the WBC was normal.  Recommending discharge from hematology clinic and follow-up with primary care.  Patient was advised to reach back to [place] if she has any concerns at any point.  I have reviewed the past medical history, past surgical history, social history and family history with the patient   ALLERGIES:  is allergic to ciprofloxacin, nsaids, sudafed [pseudoephedrine], sulfa drugs cross reactors, tolmetin, and aspirin.  MEDICATIONS:  Current Outpatient Medications  Medication Sig Dispense Refill   Cholecalciferol (VITAMIN D3 PO) Take 1 tablet daily by mouth.     denosumab (PROLIA) 60 MG/ML SOSY injection Inject 60 mg into the skin every 6 (six) months.     fluticasone (FLONASE) 50 MCG/ACT nasal spray Place 1 spray into both nostrils daily.     FOLIC ACID PO Take 1 tablet daily by mouth.     hydroxychloroquine (PLAQUENIL) 200 MG tablet Take 200 mg by mouth daily.     Multiple Vitamin (MULTIVITAMIN) tablet Take 1 tablet by mouth daily.       Polyethyl Glycol-Propyl Glycol (SYSTANE OP) Apply 1 drop to eye daily as needed (dry eyes).     predniSONE (DELTASONE) 10 MG tablet Take 10 mg by mouth as needed.     zolpidem (AMBIEN) 10 MG tablet Take 1 tablet by mouth at bedtime as needed.     No current facility-administered medications for this visit.     REVIEW OF  SYSTEMS:   Constitutional: Denies fevers, chills or night sweats Eyes: Denies blurriness of vision Ears, nose, mouth, throat, and face: Denies mucositis or sore throat Respiratory: Denies cough, dyspnea or wheezes Cardiovascular: Denies palpitation, chest discomfort or lower extremity swelling Gastrointestinal:  Denies nausea, heartburn or change in bowel habits Skin: Denies  abnormal skin rashes Lymphatics: Denies new lymphadenopathy or easy bruising Neurological:Denies numbness, tingling or new weaknesses Behavioral/Psych: Mood is stable, no new changes  All other systems were reviewed with the patient and are negative.  PHYSICAL EXAMINATION:   Vitals:   11/29/22 1302  BP: 135/82  Pulse: 77  Resp: 17  Temp: 98.7 F (37.1 C)  SpO2: 100%    GENERAL:alert, no distress and comfortable SKIN: skin color, texture, turgor are normal, no rashes or significant lesions LYMPH:  no palpable lymphadenopathy in the cervical, axillary or inguinal LUNGS: clear to auscultation and percussion with normal breathing effort HEART: regular rate & rhythm and no murmurs and no lower extremity edema ABDOMEN:abdomen soft, non-tender and normal bowel sounds Musculoskeletal:no cyanosis of digits and no clubbing  NEURO: alert & oriented x 3 with fluent speech  LABORATORY DATA:  I have reviewed the data as listed  Lab Results  Component Value Date   WBC 4.6 11/01/2022   NEUTROABS 2.8 11/01/2022   HGB 12.4 11/01/2022   HCT 36.8 11/01/2022   MCV 83.6 11/01/2022   PLT 232 11/01/2022      Component Value Date/Time   NA 138 11/01/2022 1123   K 4.2 11/01/2022 1123   CL 103 11/01/2022 1123   CO2 26 11/01/2022 1123   GLUCOSE 93 11/01/2022 1123   BUN 15 11/01/2022 1123   CREATININE 0.79 11/01/2022 1123   CREATININE 0.59 06/22/2010 0000   CALCIUM 9.2 11/01/2022 1123   PROT 7.4 11/01/2022 1123   ALBUMIN 4.2 11/01/2022 1123   AST 22 11/01/2022 1123   ALT 19 11/01/2022 1123   ALKPHOS 34 (L) 11/01/2022 1123   BILITOT 0.7 11/01/2022 1123   GFRNONAA >60 11/01/2022 1123   GFRNONAA >60 06/22/2010 0000   GFRAA >60 08/10/2018 1625   GFRAA >60 06/22/2010 0000      Chemistry      Component Value Date/Time   NA 138 11/01/2022 1123   K 4.2 11/01/2022 1123   CL 103 11/01/2022 1123   CO2 26 11/01/2022 1123   BUN 15 11/01/2022 1123   CREATININE 0.79 11/01/2022 1123    CREATININE 0.59 06/22/2010 0000      Component Value Date/Time   CALCIUM 9.2 11/01/2022 1123   ALKPHOS 34 (L) 11/01/2022 1123   AST 22 11/01/2022 1123   ALT 19 11/01/2022 1123   BILITOT 0.7 11/01/2022 1123      Latest Reference Range & Units 11/01/22 11:23  Hep A Ab, IgM NON REACTIVE  NON REACTIVE  Hepatitis B Surface Ag NON REACTIVE  NON REACTIVE  Hep B Core Ab, IgM NON REACTIVE  NON REACTIVE  HCV Ab NON REACTIVE  NON REACTIVE  HIV Screen 4th Generation wRfx Non Reactive  Non Reactive    Latest Reference Range & Units 04/28/10 08:56 11/01/22 11:23  Iron 28 - 170 ug/dL 41 (L) 409  UIBC ug/dL 811 914  TIBC 782 - 956 ug/dL 213 (L) 086  Saturation Ratios 10.4 - 31.8 % 19 (L) 25  Ferritin 11 - 307 ng/mL 1778 (NOTE) Result repeated and verified. Result confirmed by automatic dilution. (H) 55  Folate >5.9 ng/mL 11.8 (NOTE)  Reference Ranges  Deficient:       0.4 - 3.3 ng/mL        Indeterminate:   3.4 - 5.4 ng/mL        Normal:              > 5.4 ng/mL 13.4  Vitamin B12 180 - 914 pg/mL 681 1,045 (H)  (L): Data is abnormally low (H): Data is abnormally high

## 2022-11-29 NOTE — Assessment & Plan Note (Signed)
Diagnosed in 2016, currently managed with Plaquenil. Patient reports occasional use of prednisone. -Continue current management and follow-up with rheumatology

## 2022-11-29 NOTE — Assessment & Plan Note (Addendum)
Initially low, possibly due to Plaquenil and concurrent infection. Current labs show normal white blood cell count. No current symptoms of infection or disease.  Hepatitis panel, HIV negative.  Normal B12, folate, iron levels.  Did not send a flow cytometry as the WBC came back normal on repeat testing. -Discharge from clinic. -Follow up with primary care provider. -Primary care provider can refer back to this clinic if future concerns arise.

## 2022-11-30 DIAGNOSIS — E559 Vitamin D deficiency, unspecified: Secondary | ICD-10-CM | POA: Diagnosis not present

## 2022-11-30 DIAGNOSIS — M810 Age-related osteoporosis without current pathological fracture: Secondary | ICD-10-CM | POA: Diagnosis not present

## 2022-11-30 DIAGNOSIS — Z1212 Encounter for screening for malignant neoplasm of rectum: Secondary | ICD-10-CM | POA: Diagnosis not present

## 2022-11-30 DIAGNOSIS — D638 Anemia in other chronic diseases classified elsewhere: Secondary | ICD-10-CM | POA: Diagnosis not present

## 2022-11-30 DIAGNOSIS — Z79899 Other long term (current) drug therapy: Secondary | ICD-10-CM | POA: Diagnosis not present

## 2022-11-30 DIAGNOSIS — R7989 Other specified abnormal findings of blood chemistry: Secondary | ICD-10-CM | POA: Diagnosis not present

## 2022-12-03 ENCOUNTER — Other Ambulatory Visit: Payer: Medicare PPO

## 2022-12-03 ENCOUNTER — Inpatient Hospital Stay: Payer: Medicare PPO | Admitting: Oncology

## 2022-12-07 DIAGNOSIS — Z1339 Encounter for screening examination for other mental health and behavioral disorders: Secondary | ICD-10-CM | POA: Diagnosis not present

## 2022-12-07 DIAGNOSIS — M810 Age-related osteoporosis without current pathological fracture: Secondary | ICD-10-CM | POA: Diagnosis not present

## 2022-12-07 DIAGNOSIS — M3210 Systemic lupus erythematosus, organ or system involvement unspecified: Secondary | ICD-10-CM | POA: Diagnosis not present

## 2022-12-07 DIAGNOSIS — D692 Other nonthrombocytopenic purpura: Secondary | ICD-10-CM | POA: Diagnosis not present

## 2022-12-07 DIAGNOSIS — D72819 Decreased white blood cell count, unspecified: Secondary | ICD-10-CM | POA: Diagnosis not present

## 2022-12-07 DIAGNOSIS — R82998 Other abnormal findings in urine: Secondary | ICD-10-CM | POA: Diagnosis not present

## 2022-12-07 DIAGNOSIS — D638 Anemia in other chronic diseases classified elsewhere: Secondary | ICD-10-CM | POA: Diagnosis not present

## 2022-12-07 DIAGNOSIS — Z23 Encounter for immunization: Secondary | ICD-10-CM | POA: Diagnosis not present

## 2022-12-07 DIAGNOSIS — Z0000 Encounter for general adult medical examination without abnormal findings: Secondary | ICD-10-CM | POA: Diagnosis not present

## 2022-12-07 DIAGNOSIS — F419 Anxiety disorder, unspecified: Secondary | ICD-10-CM | POA: Diagnosis not present

## 2022-12-07 DIAGNOSIS — D8989 Other specified disorders involving the immune mechanism, not elsewhere classified: Secondary | ICD-10-CM | POA: Diagnosis not present

## 2022-12-07 DIAGNOSIS — E559 Vitamin D deficiency, unspecified: Secondary | ICD-10-CM | POA: Diagnosis not present

## 2022-12-07 DIAGNOSIS — Z1331 Encounter for screening for depression: Secondary | ICD-10-CM | POA: Diagnosis not present

## 2022-12-31 ENCOUNTER — Ambulatory Visit
Admission: RE | Admit: 2022-12-31 | Discharge: 2022-12-31 | Disposition: A | Discharge status: Home / Self Care | Payer: Medicare PPO | Source: Ambulatory Visit | Attending: Internal Medicine | Admitting: Internal Medicine

## 2022-12-31 DIAGNOSIS — Z1231 Encounter for screening mammogram for malignant neoplasm of breast: Secondary | ICD-10-CM

## 2023-01-02 DIAGNOSIS — M810 Age-related osteoporosis without current pathological fracture: Secondary | ICD-10-CM | POA: Diagnosis not present

## 2023-02-25 DIAGNOSIS — Z961 Presence of intraocular lens: Secondary | ICD-10-CM | POA: Diagnosis not present

## 2023-02-25 DIAGNOSIS — H52203 Unspecified astigmatism, bilateral: Secondary | ICD-10-CM | POA: Diagnosis not present

## 2023-02-25 DIAGNOSIS — M3219 Other organ or system involvement in systemic lupus erythematosus: Secondary | ICD-10-CM | POA: Diagnosis not present

## 2023-02-25 DIAGNOSIS — H04123 Dry eye syndrome of bilateral lacrimal glands: Secondary | ICD-10-CM | POA: Diagnosis not present

## 2023-02-25 DIAGNOSIS — H5213 Myopia, bilateral: Secondary | ICD-10-CM | POA: Diagnosis not present

## 2023-02-25 DIAGNOSIS — H524 Presbyopia: Secondary | ICD-10-CM | POA: Diagnosis not present

## 2023-02-25 DIAGNOSIS — H43812 Vitreous degeneration, left eye: Secondary | ICD-10-CM | POA: Diagnosis not present

## 2023-02-25 DIAGNOSIS — H26493 Other secondary cataract, bilateral: Secondary | ICD-10-CM | POA: Diagnosis not present

## 2023-02-25 DIAGNOSIS — Z79899 Other long term (current) drug therapy: Secondary | ICD-10-CM | POA: Diagnosis not present

## 2023-03-19 DIAGNOSIS — R399 Unspecified symptoms and signs involving the genitourinary system: Secondary | ICD-10-CM | POA: Diagnosis not present

## 2023-04-03 DIAGNOSIS — R21 Rash and other nonspecific skin eruption: Secondary | ICD-10-CM | POA: Diagnosis not present

## 2023-04-03 DIAGNOSIS — M329 Systemic lupus erythematosus, unspecified: Secondary | ICD-10-CM | POA: Diagnosis not present

## 2023-04-03 DIAGNOSIS — M542 Cervicalgia: Secondary | ICD-10-CM | POA: Diagnosis not present

## 2023-04-03 DIAGNOSIS — Z79899 Other long term (current) drug therapy: Secondary | ICD-10-CM | POA: Diagnosis not present

## 2023-04-03 DIAGNOSIS — M810 Age-related osteoporosis without current pathological fracture: Secondary | ICD-10-CM | POA: Diagnosis not present

## 2023-04-03 DIAGNOSIS — M65311 Trigger thumb, right thumb: Secondary | ICD-10-CM | POA: Diagnosis not present

## 2023-04-03 DIAGNOSIS — L659 Nonscarring hair loss, unspecified: Secondary | ICD-10-CM | POA: Diagnosis not present

## 2023-04-03 DIAGNOSIS — D649 Anemia, unspecified: Secondary | ICD-10-CM | POA: Diagnosis not present

## 2023-04-03 DIAGNOSIS — Z681 Body mass index (BMI) 19 or less, adult: Secondary | ICD-10-CM | POA: Diagnosis not present

## 2023-06-13 DIAGNOSIS — D692 Other nonthrombocytopenic purpura: Secondary | ICD-10-CM | POA: Diagnosis not present

## 2023-06-13 DIAGNOSIS — M810 Age-related osteoporosis without current pathological fracture: Secondary | ICD-10-CM | POA: Diagnosis not present

## 2023-06-13 DIAGNOSIS — J302 Other seasonal allergic rhinitis: Secondary | ICD-10-CM | POA: Diagnosis not present

## 2023-06-13 DIAGNOSIS — N390 Urinary tract infection, site not specified: Secondary | ICD-10-CM | POA: Diagnosis not present

## 2023-06-13 DIAGNOSIS — D638 Anemia in other chronic diseases classified elsewhere: Secondary | ICD-10-CM | POA: Diagnosis not present

## 2023-06-13 DIAGNOSIS — M3210 Systemic lupus erythematosus, organ or system involvement unspecified: Secondary | ICD-10-CM | POA: Diagnosis not present

## 2023-06-13 DIAGNOSIS — E559 Vitamin D deficiency, unspecified: Secondary | ICD-10-CM | POA: Diagnosis not present

## 2023-06-13 DIAGNOSIS — H259 Unspecified age-related cataract: Secondary | ICD-10-CM | POA: Diagnosis not present

## 2023-06-13 DIAGNOSIS — D8989 Other specified disorders involving the immune mechanism, not elsewhere classified: Secondary | ICD-10-CM | POA: Diagnosis not present

## 2023-06-19 ENCOUNTER — Telehealth (HOSPITAL_COMMUNITY): Payer: Self-pay

## 2023-06-19 ENCOUNTER — Other Ambulatory Visit: Payer: Self-pay | Admitting: Internal Medicine

## 2023-06-19 DIAGNOSIS — M810 Age-related osteoporosis without current pathological fracture: Secondary | ICD-10-CM | POA: Insufficient documentation

## 2023-06-19 NOTE — Telephone Encounter (Signed)
 Auth Submission: APPROVED Site of care: Site of care: [facility] Payer: [employer] Medication & CPT/J Code(s) submitted: Prolia  (Denosumab ) [id number] Route of submission (phone, fax, portal): portal Phone # Fax # Auth type: Buy/Bill PB Units/visits requested: 60mg , q6months x 2 doses Reference number: [id number] Approval from: 01/16/2023 to 01/15/24

## 2023-06-26 ENCOUNTER — Inpatient Hospital Stay (HOSPITAL_COMMUNITY): Admission: RE | Admit: 2023-06-26 | Source: Ambulatory Visit

## 2023-07-04 ENCOUNTER — Encounter: Attending: Gastroenterology | Admitting: Emergency Medicine

## 2023-07-04 VITALS — BP 126/70 | HR 71 | Temp 98.3°F | Resp 16

## 2023-07-04 DIAGNOSIS — M810 Age-related osteoporosis without current pathological fracture: Secondary | ICD-10-CM | POA: Diagnosis not present

## 2023-07-04 MED ORDER — DENOSUMAB 60 MG/ML ~~LOC~~ SOSY
60.0000 mg | PREFILLED_SYRINGE | Freq: Once | SUBCUTANEOUS | Status: AC
  Administered 2023-07-04: 60 mg via SUBCUTANEOUS

## 2023-07-04 NOTE — Progress Notes (Addendum)
 Diagnosis: Osteoporosis  Provider:  Richard W. Tisovec, MD  Procedure: Injection  Prolia  (Denosumab ), Dose: 60 mg, Site: subcutaneous, Number of injections: 1  Injection Site(s): Right upper quad. abdomen  Post Care: Patient declined observation  Discharge: Condition: Good, Destination: Home . AVS Declined  Performed by:  Arlina Benjamin, RN

## 2023-08-29 DIAGNOSIS — H04123 Dry eye syndrome of bilateral lacrimal glands: Secondary | ICD-10-CM | POA: Diagnosis not present

## 2023-08-29 DIAGNOSIS — Z79899 Other long term (current) drug therapy: Secondary | ICD-10-CM | POA: Diagnosis not present

## 2023-08-29 DIAGNOSIS — M3210 Systemic lupus erythematosus, organ or system involvement unspecified: Secondary | ICD-10-CM | POA: Diagnosis not present

## 2023-10-02 DIAGNOSIS — M542 Cervicalgia: Secondary | ICD-10-CM | POA: Diagnosis not present

## 2023-10-02 DIAGNOSIS — M65311 Trigger thumb, right thumb: Secondary | ICD-10-CM | POA: Diagnosis not present

## 2023-10-02 DIAGNOSIS — M810 Age-related osteoporosis without current pathological fracture: Secondary | ICD-10-CM | POA: Diagnosis not present

## 2023-10-02 DIAGNOSIS — D649 Anemia, unspecified: Secondary | ICD-10-CM | POA: Diagnosis not present

## 2023-10-02 DIAGNOSIS — R300 Dysuria: Secondary | ICD-10-CM | POA: Diagnosis not present

## 2023-10-02 DIAGNOSIS — R21 Rash and other nonspecific skin eruption: Secondary | ICD-10-CM | POA: Diagnosis not present

## 2023-10-02 DIAGNOSIS — M329 Systemic lupus erythematosus, unspecified: Secondary | ICD-10-CM | POA: Diagnosis not present

## 2023-10-02 DIAGNOSIS — L659 Nonscarring hair loss, unspecified: Secondary | ICD-10-CM | POA: Diagnosis not present

## 2023-10-02 DIAGNOSIS — Z79899 Other long term (current) drug therapy: Secondary | ICD-10-CM | POA: Diagnosis not present

## 2023-10-24 ENCOUNTER — Encounter: Payer: Self-pay | Admitting: Gastroenterology

## 2023-10-28 ENCOUNTER — Telehealth: Payer: Self-pay

## 2023-10-28 NOTE — Telephone Encounter (Signed)
 Auth Submission: APPROVED Site of care: Site of care: [facility] Payer: [employer] MEDICARE Medication & CPT/J Code(s) submitted: Prolia  (Denosumab ) [id number] Diagnosis Code:  Route of submission (phone, fax, portal): portal Phone # Fax # Auth type: Buy/Bill PB Units/visits requested: 60mg  q46months Reference number: [id number] Approval from: 01/16/24 to 01/14/25

## 2023-10-30 ENCOUNTER — Ambulatory Visit (INDEPENDENT_AMBULATORY_CARE_PROVIDER_SITE_OTHER)

## 2023-10-30 ENCOUNTER — Encounter (INDEPENDENT_AMBULATORY_CARE_PROVIDER_SITE_OTHER): Payer: Self-pay | Admitting: Gastroenterology

## 2023-10-30 ENCOUNTER — Ambulatory Visit: Admitting: Podiatry

## 2023-10-30 DIAGNOSIS — M2012 Hallux valgus (acquired), left foot: Secondary | ICD-10-CM | POA: Diagnosis not present

## 2023-10-30 DIAGNOSIS — M21619 Bunion of unspecified foot: Secondary | ICD-10-CM

## 2023-10-30 DIAGNOSIS — M21612 Bunion of left foot: Secondary | ICD-10-CM | POA: Diagnosis not present

## 2023-10-30 NOTE — Progress Notes (Signed)
 Chief Complaint  Patient presents with   Bunions    L bunion pain.  Had R bunion >10 yrs ago and never did her L   not diabetic. No anti caog    Subjective: 73 y.o. female presents today for evaluation of symptomatic bunion to the left foot.  History of bunionectomy to the right foot over 10 years ago.  It is doing well.  She states that over the last several years the pain to the left foot bunion has slowly increased.  It is now affecting her on a daily basis and rubs in all shoes despite shoe gear modifications.  Past Medical History:  Diagnosis Date   Allergy    SEASONAL   Anemia    Arthritis    Cataract    BILATERAL,REMOVED   Diverticulosis 01/15/2005   Dr Harvey   Febrile illness    Lupus    Osteoporosis    Pyelonephritis    pt denies   Rheumatoid arthritis(714.0)    Sickle cell trait     Past Surgical History:  Procedure Laterality Date   ABDOMINAL HYSTERECTOMY     ABDOMINAL HYSTERECTOMY     BUNIONECTOMY     COLONOSCOPY  10/02/2005   SLF: Rare diverticulosis.  Otherwise, no polyps, masses, inflammatory changes or vascular ectasia seen/ Normal retroflexed view of the rectum   COLONOSCOPY  12/07/2011   Procedure: COLONOSCOPY;  Surgeon: Margo LITTIE Harvey, MD;  Location: [facility];  Service: Endoscopy;  Laterality: N/A;  3:30   ESOPHAGEAL MANOMETRY N/A 02/11/2015   Procedure: ESOPHAGEAL MANOMETRY (EM);  Surgeon: Gustav Shila GAILS, MD;  Location: [facility];  Service: Endoscopy;  Laterality: N/A;   UPPER GASTROINTESTINAL ENDOSCOPY      Allergies  Allergen Reactions   Ciprofloxacin Swelling   Nsaids     Ringing in ears,    Sudafed [Pseudoephedrine] Tinitus   Sulfa Drugs Cross Reactors Other (See Comments)    Child hood allergy    Tolmetin Other (See Comments)    Ringing in ears,    Aspirin Other (See Comments)    Ringing in ears     Objective: Physical Exam General: The patient is alert and oriented x3 in no acute distress.  Dermatology: Skin is  cool, dry and supple bilateral lower extremities. Negative for open lesions or macerations.  Vascular: Palpable pedal pulses bilaterally. No edema or erythema noted. Capillary refill within normal limits.  Neurological: Grossly intact via light touch Musculoskeletal Exam: Clinical evidence of bunion deformity noted to the respective foot. There is moderate pain on palpation range of motion of the first MPJ. Lateral deviation of the hallux noted consistent with hallux abductovalgus.  Radiographic Exam LT foot 10/30/2023: Normal osseous mineralization.  No acute fractures identified.  Increased intermetatarsal angle with hallux abductus angle on AP view  Assessment: 1.  Hallux valgus left 2.  PSxHx bunionectomy right  ~ 2018-2019   Plan of Care:  -Patient was evaluated. X-Rays reviewed. -Today we discussed the pathology and etiology of bunion deformity and surgical correction.  Unfortunately she has failed conservative treatment and she is ready to proceed with surgery to correct for her bunion to the left foot.  Risk benefits advantages and disadvantages of the procedure were explained in length in detail to the patient.  No guarantees were expressed or implied.  All patient questions answered.  She consents and would like to proceed -Authorization for surgery was initiated today.  Surgery will consist of bunionectomy with osteotomy left foot -Return  to clinic 1 week postop   Thresa EMERSON Sar, DPM [facility]  Dr. Thresa EMERSON Sar, DPM    [address] [name], [place] [address]                Office [phone]  Fax [phone]

## 2023-11-06 ENCOUNTER — Telehealth: Payer: Self-pay | Admitting: Podiatry

## 2023-11-06 NOTE — Telephone Encounter (Signed)
 Called and scheduled patient for surgery on 12/19/2023. Patien is not on GLP1 or blood thinners. Confirmed receipt of surgery bag and Is aware [facility] will call 24-48 hours prior with surgery time/arrival. Patients preferred pharmacy is marked and set in chart.

## 2023-11-07 ENCOUNTER — Telehealth: Payer: Self-pay | Admitting: Podiatry

## 2023-11-07 NOTE — Telephone Encounter (Signed)
 DOS- 12/19/2023  [place] BUNIONECTOMY LT- 71703  [employer] EFFECTIVE DATE- 01/16/2019  DEDUCTIBLE- N/A OOP- $4000 REMAINING- $3712.90 COINSURANCE- 0%  PER [place] PORTAL, PRIOR AUTH FOR CPT CODE 71703 HAS BEEN APPROVED FROM 12/19/2023-03/18/2024. AUTH# [id number]

## 2023-11-26 ENCOUNTER — Other Ambulatory Visit: Payer: Self-pay | Admitting: Internal Medicine

## 2023-11-26 DIAGNOSIS — Z1231 Encounter for screening mammogram for malignant neoplasm of breast: Secondary | ICD-10-CM

## 2023-12-10 DIAGNOSIS — D638 Anemia in other chronic diseases classified elsewhere: Secondary | ICD-10-CM | POA: Diagnosis not present

## 2023-12-10 DIAGNOSIS — Z1331 Encounter for screening for depression: Secondary | ICD-10-CM | POA: Diagnosis not present

## 2023-12-10 DIAGNOSIS — Z1339 Encounter for screening examination for other mental health and behavioral disorders: Secondary | ICD-10-CM | POA: Diagnosis not present

## 2023-12-10 DIAGNOSIS — E559 Vitamin D deficiency, unspecified: Secondary | ICD-10-CM | POA: Diagnosis not present

## 2023-12-10 DIAGNOSIS — R030 Elevated blood-pressure reading, without diagnosis of hypertension: Secondary | ICD-10-CM | POA: Diagnosis not present

## 2023-12-10 DIAGNOSIS — M810 Age-related osteoporosis without current pathological fracture: Secondary | ICD-10-CM | POA: Diagnosis not present

## 2023-12-10 DIAGNOSIS — F419 Anxiety disorder, unspecified: Secondary | ICD-10-CM | POA: Diagnosis not present

## 2023-12-10 DIAGNOSIS — M3210 Systemic lupus erythematosus, organ or system involvement unspecified: Secondary | ICD-10-CM | POA: Diagnosis not present

## 2023-12-10 DIAGNOSIS — J302 Other seasonal allergic rhinitis: Secondary | ICD-10-CM | POA: Diagnosis not present

## 2023-12-10 DIAGNOSIS — Z23 Encounter for immunization: Secondary | ICD-10-CM | POA: Diagnosis not present

## 2023-12-10 DIAGNOSIS — Z0000 Encounter for general adult medical examination without abnormal findings: Secondary | ICD-10-CM | POA: Diagnosis not present

## 2023-12-19 ENCOUNTER — Other Ambulatory Visit: Payer: Self-pay | Admitting: Podiatry

## 2023-12-19 DIAGNOSIS — M2012 Hallux valgus (acquired), left foot: Secondary | ICD-10-CM | POA: Diagnosis not present

## 2023-12-19 MED ORDER — OXYCODONE-ACETAMINOPHEN 5-325 MG PO TABS: 1.0000 | ORAL_TABLET | ORAL | 0 refills | Status: DC | PRN

## 2023-12-19 NOTE — Progress Notes (Signed)
 PRN postop

## 2023-12-25 ENCOUNTER — Ambulatory Visit (INDEPENDENT_AMBULATORY_CARE_PROVIDER_SITE_OTHER): Admitting: Podiatry

## 2023-12-25 ENCOUNTER — Ambulatory Visit (INDEPENDENT_AMBULATORY_CARE_PROVIDER_SITE_OTHER)

## 2023-12-25 VITALS — BP 115/69 | HR 94 | Temp 99.0°F

## 2023-12-25 DIAGNOSIS — M2012 Hallux valgus (acquired), left foot: Secondary | ICD-10-CM

## 2023-12-25 NOTE — Progress Notes (Signed)
 Patient presents for post-op visit today, POV#1 DOS 12/19/2023 [facility] [place] BUNIONECTOMY  Painful since surgery, but getting better..  RN Notes: n/a  Vital Signs: Today's Vitals   12/25/23 1107  BP: 115/69  Pulse: 94  Temp: 99 F (37.2 C)  TempSrc: Oral  PainSc: 5   PainLoc: Foot      Radiographs: [x]  Taken []  Not taken  Surgical Site Assessment:  - Dressing:  [x]  Minimal dry blood, intact []  Reinforced   [x]  Changed     -RN Notes: n/a  - Incision:  [x]  CDI (clean, dry, intact)  [x]  Mild erythema  []  Drainage noted   -RN Notes: n/a  - Swelling:  []  None  [x]  Mild  []  Moderate   []  Significant     -RN Notes: n/a  - Bruising:  []  None  [x]  Present: across top of foot and base of 1st hallux   - Sutures/Staples:  []  None [x]  Intact  []  Removed Today  [x]  Plan to remove at next visit   -Cast/Splint/Pins: [x]  None []  Intact []  Removed Today []  Plan to remove at next visit []  Replaced  -Signs of infection:  [x]  None  []  Present - Describe: n/a  -DME:    []  None [x]  AFW []  Surgical shoe []  Cast  []  Splint  -Walking status:  []  Full WB  []  Partial WB  [x]  NWB  -Utilizing device:  []  None []  Knee Scooter []  Crutches [x]  Wheelchair    DVT assessment:  [x]  Denies symptoms []  Chest pain/SOB []  Pain in calf/redness/warmth   Redressed DSD and ace wrap. Educated on signs of infection, proper dressing care, pain management, and weight bearing status. Patient will contact provider with any new or worsening symptoms. The provider assessed the patient today and reviewed instructions regarding plan of care.

## 2023-12-25 NOTE — Progress Notes (Signed)
° °  Chief Complaint  Patient presents with   Post-op Follow-up    POV#1 DOS 12/19/2023 [facility] [place] BUNIONECTOM[facility]  Painful since surgery, but getting better.    Subjective:  Patient presents today status post bunionectomy with double osteotomy of the left foot.  DOS: 12/19/2023.  Doing well.  Past Medical History:  Diagnosis Date   Allergy    SEASONAL   Anemia    Arthritis    Cataract    BILATERAL,REMOVED   Diverticulosis 01/15/2005   Dr Harvey   Febrile illness    Lupus    Osteoporosis    Pyelonephritis    pt denies   Rheumatoid arthritis(714.0)    Sickle cell trait     Past Surgical History:  Procedure Laterality Date   ABDOMINAL HYSTERECTOMY     ABDOMINAL HYSTERECTOMY     BUNIONECTOMY     COLONOSCOPY  10/02/2005   SLF: Rare diverticulosis.  Otherwise, no polyps, masses, inflammatory changes or vascular ectasia seen/ Normal retroflexed view of the rectum   COLONOSCOPY  12/07/2011   Procedure: COLONOSCOPY;  Surgeon: Margo LITTIE Harvey, MD;  Location: [facility];  Service: Endoscopy;  Laterality: N/A;  3:30   ESOPHAGEAL MANOMETRY N/A 02/11/2015   Procedure: ESOPHAGEAL MANOMETRY (EM);  Surgeon: Gustav Shila GAILS, MD;  Location: [facility];  Service: Endoscopy;  Laterality: N/A;   UPPER GASTROINTESTINAL ENDOSCOPY      Allergies  Allergen Reactions   Ciprofloxacin Swelling   Nsaids     Ringing in ears,    Sudafed [Pseudoephedrine] Tinitus   Sulfa Drugs Cross Reactors Other (See Comments)    Child hood allergy    Tolmetin Other (See Comments)    Ringing in ears,    Aspirin Other (See Comments)    Ringing in ears    Objective/Physical Exam Neurovascular status intact.  Incision well coapted with sutures intact. No sign of infectious process noted. No dehiscence. No active bleeding noted.  Moderate edema noted to the surgical extremity.  Radiographic Exam LT foot 12/25/2023:  Orthopedic hardware and osteotomies sites appear to be stable with routine healing.   Overall decent alignment of the first ray  Assessment: 1. s/p bunionectomy with double osteotomy left. DOS: 12/19/2023   Plan of Care:  -Patient was evaluated. X-rays reviewed - Dressings changed.  Leave clean dry and intact x 1 week -Minimal WBAT CAM boot -Return to clinic 1 week suture removal   Thresa EMERSON Sar, DPM [facility]  Dr. Thresa EMERSON Sar, DPM    [address] [place], [place] [address]                Office [phone]  Fax [phone]

## 2024-01-01 ENCOUNTER — Ambulatory Visit (INDEPENDENT_AMBULATORY_CARE_PROVIDER_SITE_OTHER): Admitting: Podiatry

## 2024-01-01 DIAGNOSIS — M2012 Hallux valgus (acquired), left foot: Secondary | ICD-10-CM

## 2024-01-01 NOTE — Progress Notes (Signed)
 Patient presents for post-op visit today, POV#2 DOS 12/19/2023 [facility] [place] BUNIONECTOM[facility]  Doing okay so far. Pain has come back as I have been trying to move around and it is a little bit itchy..  RN Notes: n/a  Vital Signs: Today's Vitals   01/01/24 1111  PainSc: 0-No pain      Radiographs: []  Taken [x]  Not taken  Surgical Site Assessment:  - Dressing:  [x]  Minimal dry blood, intact []  Reinforced   [x]  Changed     -RN Notes: n/a  - Incision:  []  CDI (clean, dry, intact)  [x]  Mild erythema  [x]  Drainage noted   -RN Notes: serosanguinous drainage, some open area proximally along incision  - Swelling:  []  None  []  Mild  [x]  Moderate   []  Significant     -RN Notes: n/a  - Bruising:  []  None  [x]  Present: around surgical site.    - Sutures/Staples:  []  None [x]  Intact  []  Removed Today  [x]  Plan to remove at next visit   -Cast/Splint/Pins: [x]  None []  Intact []  Removed Today []  Plan to remove at next visit []  Replaced  -Signs of infection:  []  None  [x]  Present - Describe: redness, swelling  -DME:    []  None [x]  AFW []  Surgical shoe []  Cast  []  Splint  -Walking status:  []  Full WB  [x]  Partial WB  []  NWB  -Utilizing device:  []  None []  Knee Scooter [x]  Crutches []  Wheelchair    DVT assessment:  [x]  Denies symptoms []  Chest pain/SOB []  Pain in calf/redness/warmth   Redressed DSD and ace wrap. Educated on signs of infection, proper dressing care, pain management, and weight bearing status. Patient will contact provider with any new or worsening symptoms. The provider assessed the patient today and reviewed instructions regarding plan of care.

## 2024-01-02 ENCOUNTER — Inpatient Hospital Stay: Admission: RE | Admit: 2024-01-02 | Discharge: 2024-01-02 | Attending: Internal Medicine | Admitting: Internal Medicine

## 2024-01-02 ENCOUNTER — Telehealth: Payer: Self-pay | Admitting: Podiatry

## 2024-01-02 ENCOUNTER — Other Ambulatory Visit: Payer: Self-pay | Admitting: Podiatry

## 2024-01-02 DIAGNOSIS — Z1231 Encounter for screening mammogram for malignant neoplasm of breast: Secondary | ICD-10-CM

## 2024-01-02 MED ORDER — OXYCODONE-ACETAMINOPHEN 5-325 MG PO TABS: 1.0000 | ORAL_TABLET | ORAL | 0 refills | Status: AC | PRN

## 2024-01-02 NOTE — Progress Notes (Signed)
 PRN postop

## 2024-01-02 NOTE — Telephone Encounter (Signed)
 Patient called in regards to oxycodone  prescription. She contacted pharmacy and they don't have anything on file for her. Can prescription please be sent to same pharmacy as last time? Thanks!

## 2024-01-06 ENCOUNTER — Ambulatory Visit

## 2024-01-06 VITALS — BP 128/84 | HR 71 | Temp 99.1°F | Resp 18

## 2024-01-06 DIAGNOSIS — M810 Age-related osteoporosis without current pathological fracture: Secondary | ICD-10-CM | POA: Diagnosis not present

## 2024-01-06 MED ORDER — DENOSUMAB 60 MG/ML ~~LOC~~ SOSY
60.0000 mg | PREFILLED_SYRINGE | Freq: Once | SUBCUTANEOUS | Status: AC
  Administered 2024-01-06: 60 mg via SUBCUTANEOUS

## 2024-01-06 NOTE — Progress Notes (Signed)
 Diagnosis: Osteoporosis  Provider:  Richard Tisovec, MD  Procedure: Injection  Prolia  (Denosumab ), Dose: 60 mg, Site: subcutaneous, Number of injections: 1  Injection Site(s): Right upper quad. abdomen  Post Care: Observation period completed  Discharge: Condition: Good, Destination: Home . AVS Declined  Performed by:  Baldwin Darice Helling, RN

## 2024-01-08 ENCOUNTER — Ambulatory Visit (INDEPENDENT_AMBULATORY_CARE_PROVIDER_SITE_OTHER): Admitting: Podiatry

## 2024-01-08 ENCOUNTER — Encounter: Payer: Self-pay | Admitting: Podiatry

## 2024-01-08 VITALS — Ht 61.0 in | Wt 96.2 lb

## 2024-01-08 DIAGNOSIS — M2012 Hallux valgus (acquired), left foot: Secondary | ICD-10-CM

## 2024-01-08 DIAGNOSIS — Z4789 Encounter for other orthopedic aftercare: Secondary | ICD-10-CM

## 2024-01-08 MED ORDER — CEPHALEXIN 500 MG PO CAPS: 500.0000 mg | ORAL_CAPSULE | Freq: Three times a day (TID) | ORAL | 0 refills | Status: AC

## 2024-01-08 NOTE — Progress Notes (Signed)
" ° °  Chief Complaint  Patient presents with   Routine Post Op    POV#3 DOS 12/19/2023 [facility] [place] BUNIONECTOM[facility], States everything is going well, still has some pain.    Subjective:  Patient presents today status post bunionectomy with osteotomy left.  DOS: 12/19/2023.  Doing well.  WBAT cam boot as instructed  Past Medical History:  Diagnosis Date   Allergy    SEASONAL   Anemia    Arthritis    Cataract    BILATERAL,REMOVED   Diverticulosis 01/15/2005   Dr Harvey   Febrile illness    Lupus    Osteoporosis    Pyelonephritis    pt denies   Rheumatoid arthritis(714.0)    Sickle cell trait     Past Surgical History:  Procedure Laterality Date   ABDOMINAL HYSTERECTOMY     ABDOMINAL HYSTERECTOMY     BUNIONECTOMY     COLONOSCOPY  10/02/2005   SLF: Rare diverticulosis.  Otherwise, no polyps, masses, inflammatory changes or vascular ectasia seen/ Normal retroflexed view of the rectum   COLONOSCOPY  12/07/2011   Procedure: COLONOSCOPY;  Surgeon: Margo LITTIE Harvey, MD;  Location: [facility];  Service: Endoscopy;  Laterality: N/A;  3:30   ESOPHAGEAL MANOMETRY N/A 02/11/2015   Procedure: ESOPHAGEAL MANOMETRY (EM);  Surgeon: Gustav Shila GAILS, MD;  Location: [facility];  Service: Endoscopy;  Laterality: N/A;   UPPER GASTROINTESTINAL ENDOSCOPY      Allergies[1]  Objective/Physical Exam Neurovascular status intact.  Incision well coapted with sutures intact.  After removal of the sutures and light debridement of some of the loosely adhered eschar and scab along the incision site there was some very focal small areas of dehiscence.  No purulence or drainage.  No erythema.  No active bleeding noted.  Moderate edema noted to the surgical extremity.   Assessment: 1. s/p bunionectomy with osteotomy left. DOS: 12/19/2023   Plan of Care:  -Patient was evaluated.  -Sutures removed -Due to the small focal areas of dehiscence I did preventatively call in a prescription for Keflex  500 mg 3  times daily x 10 days -Okay to wash and shower and to the foot wet.  Silvadene cream with nonadherent gauze and Ace wrap's applied and provided for the patient -Return to clinic 2 weeks follow-up x-ray  Thresa EMERSON Sar, DPM [facility]  Dr. Thresa EMERSON Sar, DPM    [address] [place], [place] [address]                Office [phone]  Fax [phone]         [1]  Allergies Allergen Reactions   Ciprofloxacin Swelling   Nsaids     Ringing in ears,    Sudafed [Pseudoephedrine] Tinitus   Sulfa Drugs Cross Reactors Other (See Comments)    Child hood allergy    Tolmetin Other (See Comments)    Ringing in ears,    Aspirin Other (See Comments)    Ringing in ears   "

## 2024-01-15 ENCOUNTER — Encounter: Admitting: Podiatry

## 2024-01-20 ENCOUNTER — Encounter: Payer: Self-pay | Admitting: Podiatry

## 2024-01-20 ENCOUNTER — Ambulatory Visit (INDEPENDENT_AMBULATORY_CARE_PROVIDER_SITE_OTHER)

## 2024-01-20 ENCOUNTER — Encounter: Admitting: Podiatry

## 2024-01-20 VITALS — Ht 61.0 in | Wt 96.0 lb

## 2024-01-20 DIAGNOSIS — M2012 Hallux valgus (acquired), left foot: Secondary | ICD-10-CM

## 2024-01-20 NOTE — Progress Notes (Signed)
" ° °  Chief Complaint  Patient presents with   Routine Post Op    POV#3 DOS 12/19/2023 [facility] [place] BUNIONECTOMY([facility] Hynson) Hurts mildly in mid toe, still very swollen    Subjective:  Patient presents today status post bunionectomy with osteotomy left.  DOS: 12/19/2023.  Doing well.  WBAT cam boot as instructed  Past Medical History:  Diagnosis Date   Allergy    SEASONAL   Anemia    Arthritis    Cataract    BILATERAL,REMOVED   Diverticulosis 01/15/2005   Dr Harvey   Febrile illness    Lupus    Osteoporosis    Pyelonephritis    pt denies   Rheumatoid arthritis(714.0)    Sickle cell trait     Past Surgical History:  Procedure Laterality Date   ABDOMINAL HYSTERECTOMY     ABDOMINAL HYSTERECTOMY     BUNIONECTOMY     COLONOSCOPY  10/02/2005   SLF: Rare diverticulosis.  Otherwise, no polyps, masses, inflammatory changes or vascular ectasia seen/ Normal retroflexed view of the rectum   COLONOSCOPY  12/07/2011   Procedure: COLONOSCOPY;  Surgeon: Margo LITTIE Harvey, MD;  Location: [facility];  Service: Endoscopy;  Laterality: N/A;  3:30   ESOPHAGEAL MANOMETRY N/A 02/11/2015   Procedure: ESOPHAGEAL MANOMETRY (EM);  Surgeon: Gustav Shila GAILS, MD;  Location: [facility];  Service: Endoscopy;  Laterality: N/A;   UPPER GASTROINTESTINAL ENDOSCOPY      Allergies[1]   RT foot 01/20/2024  Objective/Physical Exam Neurovascular status intact.  Small focal area of dehiscence noted along the incision site.  Please see above noted photo.  There is no indication of infection.  No purulence or surrounding erythema.  I do believe that the small wound should heal uneventfully  Radiographic exam LT foot 01/20/2024 Unchanged compared to prior x-rays.  Good alignment of the first ray.  Orthopedic screws are intact with good apposition of the osteotomy   Assessment: 1. s/p bunionectomy with osteotomy left. DOS: 12/19/2023   Plan of Care:  -Patient was evaluated.  - Light debridement performed.   Continue Silvadene cream with a nonstick bandage - She completed the Keflex  500 mg 3 times daily x 10 days that was prescribed on 01/08/2024. - Compression ankle sleeve dispensed.  Wear daily -Continue WBAT cam boot -Return to clinic 3 weeks follow-up x-ray and to transition the patient out of the cam boot into regular sneakers and possible physical therapy  Thresa EMERSON Sar, DPM [facility]  Dr. Thresa EMERSON Sar, DPM    [address] [place], [place] [address]                Office [phone]  Fax [phone]         [1]  Allergies Allergen Reactions   Ciprofloxacin Swelling   Nsaids     Ringing in ears,    Sudafed [Pseudoephedrine] Tinitus   Sulfa Drugs Cross Reactors Other (See Comments)    Child hood allergy    Tolmetin Other (See Comments)    Ringing in ears,    Aspirin Other (See Comments)    Ringing in ears   "

## 2024-02-10 ENCOUNTER — Encounter: Admitting: Podiatry

## 2024-02-17 ENCOUNTER — Encounter: Admitting: Podiatry

## 2024-02-26 ENCOUNTER — Encounter: Admitting: Podiatry

## 2024-07-07 ENCOUNTER — Ambulatory Visit
